# Patient Record
Sex: Female | Born: 1955 | Race: White | Hispanic: No | Smoking: Former smoker
Health system: Southern US, Community
[De-identification: ages and names within clinical notes are randomized; demographics above are authoritative.]

## PROBLEM LIST (undated history)

## (undated) DIAGNOSIS — I10 Essential (primary) hypertension: Secondary | ICD-10-CM

## (undated) DIAGNOSIS — F419 Anxiety disorder, unspecified: Secondary | ICD-10-CM

## (undated) HISTORY — DX: Anxiety disorder, unspecified: F41.9

## (undated) HISTORY — PX: TUBAL LIGATION: SHX77

## (undated) HISTORY — DX: Essential (primary) hypertension: I10

## (undated) HISTORY — PX: NOVASURE ABLATION: SHX5394

---

## 1998-02-27 ENCOUNTER — Ambulatory Visit (HOSPITAL_COMMUNITY): Admission: RE | Admit: 1998-02-27 | Discharge: 1998-02-27 | Payer: Self-pay | Admitting: Neurosurgery

## 1998-03-13 ENCOUNTER — Ambulatory Visit (HOSPITAL_COMMUNITY): Admission: RE | Admit: 1998-03-13 | Discharge: 1998-03-13 | Payer: Self-pay | Admitting: Neurosurgery

## 1998-03-27 ENCOUNTER — Ambulatory Visit (HOSPITAL_COMMUNITY): Admission: RE | Admit: 1998-03-27 | Discharge: 1998-03-27 | Payer: Self-pay | Admitting: Neurosurgery

## 2000-10-13 ENCOUNTER — Other Ambulatory Visit: Admission: RE | Admit: 2000-10-13 | Discharge: 2000-10-13 | Payer: Self-pay | Admitting: *Deleted

## 2000-10-13 ENCOUNTER — Ambulatory Visit (HOSPITAL_COMMUNITY): Admission: RE | Admit: 2000-10-13 | Discharge: 2000-10-13 | Payer: Self-pay | Admitting: *Deleted

## 2000-10-13 ENCOUNTER — Encounter: Payer: Self-pay | Admitting: *Deleted

## 2001-12-30 ENCOUNTER — Ambulatory Visit (HOSPITAL_COMMUNITY): Admission: RE | Admit: 2001-12-30 | Discharge: 2001-12-30 | Payer: Self-pay | Admitting: Family Medicine

## 2001-12-30 ENCOUNTER — Encounter: Payer: Self-pay | Admitting: Family Medicine

## 2005-03-21 ENCOUNTER — Ambulatory Visit (HOSPITAL_COMMUNITY): Admission: RE | Admit: 2005-03-21 | Discharge: 2005-03-21 | Payer: Self-pay | Admitting: General Surgery

## 2005-04-02 ENCOUNTER — Encounter (INDEPENDENT_AMBULATORY_CARE_PROVIDER_SITE_OTHER): Payer: Self-pay | Admitting: General Surgery

## 2005-04-02 ENCOUNTER — Ambulatory Visit (HOSPITAL_COMMUNITY): Admission: RE | Admit: 2005-04-02 | Discharge: 2005-04-02 | Payer: Self-pay | Admitting: General Surgery

## 2006-03-10 ENCOUNTER — Ambulatory Visit: Payer: Self-pay | Admitting: Gastroenterology

## 2008-02-28 ENCOUNTER — Ambulatory Visit (HOSPITAL_COMMUNITY): Admission: RE | Admit: 2008-02-28 | Discharge: 2008-02-28 | Payer: Self-pay | Admitting: Internal Medicine

## 2009-12-25 ENCOUNTER — Ambulatory Visit (HOSPITAL_COMMUNITY): Admission: RE | Admit: 2009-12-25 | Discharge: 2009-12-25 | Payer: Self-pay | Admitting: Internal Medicine

## 2010-07-21 ENCOUNTER — Encounter: Payer: Self-pay | Admitting: Internal Medicine

## 2010-11-15 NOTE — Op Note (Signed)
Alison Allen, SCHREMP                ACCOUNT NO.:  [id number]   MEDICAL RECORD NO.:  [id number]          PATIENT TYPE:  AMB   LOCATION:  DAY                           FACILITY:  [facility]   PHYSICIAN:  Barbaraann Barthel, M.D. DATE OF BIRTH:  Apr 13, 1956   DATE OF PROCEDURE:  04/02/2005  DATE OF DISCHARGE:                                 OPERATIVE REPORT   PREOPERATIVE AND POSTOPERATIVE DIAGNOSIS:  Mass right axilla, final  pathology pending.   SPECIMEN:  Large lipomatous mass from right axilla.   PROCEDURE:  Excision of right axillary mass.   NOTE:  This is a 55 year old white female who presented to the office with  some right axillary asymmetry. She had an MRI which revealed a lipomatous  mass in this area, did not have the appearance of any malignancy, this was  quite large. We planned to remove this as an outpatient. We discussed  complications in detail not limited to but including bleeding, infection and  possibility that more surgery may be required. Informed consent was  obtained.   TECHNIQUE:  The patient was placed in the supine position with head elevated  somewhat the right axilla was prepped with Betadine solution and draped in  usual manner as was the right hemithorax.  A limb isolator was used on the  right upper extremity. A longitudinal incision was carried out just  posterior to the anterior axillary fold.  Skin, subcutaneous tissue was  excised and we entered the axilla and removed a large contained encapsulated  lipomatous mass. This was done carefully.  Small twigs of venial twigs were  clipped with a hemoclip device and a larger ones with 3-0 Vicryl ties. These  were all small tributaries. We carefully preserved from dissection the  axillary large vessels. We removed the lipoma in toto and removed this, then  sent this for final pathology. The axilla was then irrigated with normal  saline solution. We checked for hemostasis. A subcutaneous area was closed  with 2-0  Polysorb. The skin was closed with a subcuticular 4-0 Polysorb  suture with Steri-Strips, Neosporin and a sterile dressing applied. Prior to  closure all sponge views were counts found to be correct. Estimated blood  loss was minimal. No drains were placed. There were no complications.      Barbaraann Barthel, M.D.  Electronically Signed     WB/MEDQ  D:  04/02/2005  T:  04/02/2005  Job:  [id number]   cc:   Madelin Rear. Sherwood Gambler, MD  Fax: [phone]

## 2015-01-10 ENCOUNTER — Other Ambulatory Visit (HOSPITAL_COMMUNITY): Payer: Self-pay | Admitting: Internal Medicine

## 2015-01-10 DIAGNOSIS — Z1231 Encounter for screening mammogram for malignant neoplasm of breast: Secondary | ICD-10-CM

## 2015-01-22 ENCOUNTER — Ambulatory Visit (HOSPITAL_COMMUNITY): Payer: Self-pay

## 2015-01-25 ENCOUNTER — Ambulatory Visit (HOSPITAL_COMMUNITY)
Admission: RE | Admit: 2015-01-25 | Discharge: 2015-01-25 | Disposition: A | Discharge status: Home / Self Care | Payer: BLUE CROSS/BLUE SHIELD | Source: Ambulatory Visit | Attending: Internal Medicine | Admitting: Internal Medicine

## 2015-01-25 DIAGNOSIS — Z1231 Encounter for screening mammogram for malignant neoplasm of breast: Secondary | ICD-10-CM

## 2015-11-12 DIAGNOSIS — Z1389 Encounter for screening for other disorder: Secondary | ICD-10-CM | POA: Diagnosis not present

## 2015-11-12 DIAGNOSIS — Z6821 Body mass index (BMI) 21.0-21.9, adult: Secondary | ICD-10-CM | POA: Diagnosis not present

## 2015-11-12 DIAGNOSIS — F419 Anxiety disorder, unspecified: Secondary | ICD-10-CM | POA: Diagnosis not present

## 2015-11-12 DIAGNOSIS — I10 Essential (primary) hypertension: Secondary | ICD-10-CM | POA: Diagnosis not present

## 2015-11-12 DIAGNOSIS — G894 Chronic pain syndrome: Secondary | ICD-10-CM | POA: Diagnosis not present

## 2016-02-05 DIAGNOSIS — L0292 Furuncle, unspecified: Secondary | ICD-10-CM | POA: Diagnosis not present

## 2016-02-05 DIAGNOSIS — F419 Anxiety disorder, unspecified: Secondary | ICD-10-CM | POA: Diagnosis not present

## 2016-02-05 DIAGNOSIS — I10 Essential (primary) hypertension: Secondary | ICD-10-CM | POA: Diagnosis not present

## 2016-02-05 DIAGNOSIS — E063 Autoimmune thyroiditis: Secondary | ICD-10-CM | POA: Diagnosis not present

## 2016-02-05 DIAGNOSIS — Z1389 Encounter for screening for other disorder: Secondary | ICD-10-CM | POA: Diagnosis not present

## 2016-02-05 DIAGNOSIS — Z6820 Body mass index (BMI) 20.0-20.9, adult: Secondary | ICD-10-CM | POA: Diagnosis not present

## 2016-02-05 DIAGNOSIS — G894 Chronic pain syndrome: Secondary | ICD-10-CM | POA: Diagnosis not present

## 2016-07-03 DIAGNOSIS — F419 Anxiety disorder, unspecified: Secondary | ICD-10-CM | POA: Diagnosis not present

## 2016-07-03 DIAGNOSIS — I10 Essential (primary) hypertension: Secondary | ICD-10-CM | POA: Diagnosis not present

## 2016-07-03 DIAGNOSIS — Z6821 Body mass index (BMI) 21.0-21.9, adult: Secondary | ICD-10-CM | POA: Diagnosis not present

## 2016-07-03 DIAGNOSIS — Z1389 Encounter for screening for other disorder: Secondary | ICD-10-CM | POA: Diagnosis not present

## 2016-11-11 DIAGNOSIS — Z6821 Body mass index (BMI) 21.0-21.9, adult: Secondary | ICD-10-CM | POA: Diagnosis not present

## 2016-11-11 DIAGNOSIS — F419 Anxiety disorder, unspecified: Secondary | ICD-10-CM | POA: Diagnosis not present

## 2016-11-11 DIAGNOSIS — Z1389 Encounter for screening for other disorder: Secondary | ICD-10-CM | POA: Diagnosis not present

## 2016-11-11 DIAGNOSIS — I10 Essential (primary) hypertension: Secondary | ICD-10-CM | POA: Diagnosis not present

## 2016-11-11 DIAGNOSIS — G5702 Lesion of sciatic nerve, left lower limb: Secondary | ICD-10-CM | POA: Diagnosis not present

## 2016-11-11 DIAGNOSIS — E063 Autoimmune thyroiditis: Secondary | ICD-10-CM | POA: Diagnosis not present

## 2016-12-17 DIAGNOSIS — Z1211 Encounter for screening for malignant neoplasm of colon: Secondary | ICD-10-CM | POA: Diagnosis not present

## 2017-04-13 DIAGNOSIS — J069 Acute upper respiratory infection, unspecified: Secondary | ICD-10-CM | POA: Diagnosis not present

## 2017-04-13 DIAGNOSIS — J209 Acute bronchitis, unspecified: Secondary | ICD-10-CM | POA: Diagnosis not present

## 2017-04-13 DIAGNOSIS — J029 Acute pharyngitis, unspecified: Secondary | ICD-10-CM | POA: Diagnosis not present

## 2017-04-17 DIAGNOSIS — Z1389 Encounter for screening for other disorder: Secondary | ICD-10-CM | POA: Diagnosis not present

## 2017-04-17 DIAGNOSIS — J069 Acute upper respiratory infection, unspecified: Secondary | ICD-10-CM | POA: Diagnosis not present

## 2017-04-17 DIAGNOSIS — Z6821 Body mass index (BMI) 21.0-21.9, adult: Secondary | ICD-10-CM | POA: Diagnosis not present

## 2017-04-22 DIAGNOSIS — Z6821 Body mass index (BMI) 21.0-21.9, adult: Secondary | ICD-10-CM | POA: Diagnosis not present

## 2017-04-22 DIAGNOSIS — J9801 Acute bronchospasm: Secondary | ICD-10-CM | POA: Diagnosis not present

## 2017-04-22 DIAGNOSIS — R05 Cough: Secondary | ICD-10-CM | POA: Diagnosis not present

## 2017-04-22 DIAGNOSIS — T3695XA Adverse effect of unspecified systemic antibiotic, initial encounter: Secondary | ICD-10-CM | POA: Diagnosis not present

## 2017-04-22 DIAGNOSIS — Z1389 Encounter for screening for other disorder: Secondary | ICD-10-CM | POA: Diagnosis not present

## 2017-05-05 ENCOUNTER — Encounter: Payer: Self-pay | Admitting: General Practice

## 2017-07-07 DIAGNOSIS — R509 Fever, unspecified: Secondary | ICD-10-CM | POA: Diagnosis not present

## 2017-07-07 DIAGNOSIS — Z1389 Encounter for screening for other disorder: Secondary | ICD-10-CM | POA: Diagnosis not present

## 2017-07-07 DIAGNOSIS — J3489 Other specified disorders of nose and nasal sinuses: Secondary | ICD-10-CM | POA: Diagnosis not present

## 2017-07-07 DIAGNOSIS — R05 Cough: Secondary | ICD-10-CM | POA: Diagnosis not present

## 2017-07-07 DIAGNOSIS — J029 Acute pharyngitis, unspecified: Secondary | ICD-10-CM | POA: Diagnosis not present

## 2017-07-07 DIAGNOSIS — Z6821 Body mass index (BMI) 21.0-21.9, adult: Secondary | ICD-10-CM | POA: Diagnosis not present

## 2017-07-21 ENCOUNTER — Other Ambulatory Visit (HOSPITAL_COMMUNITY): Payer: Self-pay | Admitting: Internal Medicine

## 2017-07-21 DIAGNOSIS — R490 Dysphonia: Secondary | ICD-10-CM | POA: Diagnosis not present

## 2017-07-21 DIAGNOSIS — R05 Cough: Secondary | ICD-10-CM | POA: Diagnosis not present

## 2017-07-21 DIAGNOSIS — F419 Anxiety disorder, unspecified: Secondary | ICD-10-CM | POA: Diagnosis not present

## 2017-07-21 DIAGNOSIS — Z1231 Encounter for screening mammogram for malignant neoplasm of breast: Secondary | ICD-10-CM

## 2017-07-21 DIAGNOSIS — K219 Gastro-esophageal reflux disease without esophagitis: Secondary | ICD-10-CM | POA: Diagnosis not present

## 2017-07-21 DIAGNOSIS — Z1389 Encounter for screening for other disorder: Secondary | ICD-10-CM | POA: Diagnosis not present

## 2017-07-21 DIAGNOSIS — Z6820 Body mass index (BMI) 20.0-20.9, adult: Secondary | ICD-10-CM | POA: Diagnosis not present

## 2017-08-03 DIAGNOSIS — E748 Other specified disorders of carbohydrate metabolism: Secondary | ICD-10-CM | POA: Diagnosis not present

## 2017-08-13 ENCOUNTER — Ambulatory Visit (HOSPITAL_COMMUNITY)
Admission: RE | Admit: 2017-08-13 | Discharge: 2017-08-13 | Disposition: A | Discharge status: Home / Self Care | Payer: BLUE CROSS/BLUE SHIELD | Source: Ambulatory Visit | Attending: Internal Medicine | Admitting: Internal Medicine

## 2017-08-13 ENCOUNTER — Encounter (HOSPITAL_COMMUNITY): Payer: Self-pay

## 2017-08-13 DIAGNOSIS — Z1231 Encounter for screening mammogram for malignant neoplasm of breast: Secondary | ICD-10-CM

## 2017-10-21 DIAGNOSIS — Z1389 Encounter for screening for other disorder: Secondary | ICD-10-CM | POA: Diagnosis not present

## 2017-10-21 DIAGNOSIS — I10 Essential (primary) hypertension: Secondary | ICD-10-CM | POA: Diagnosis not present

## 2017-10-21 DIAGNOSIS — K130 Diseases of lips: Secondary | ICD-10-CM | POA: Diagnosis not present

## 2017-10-21 DIAGNOSIS — M5432 Sciatica, left side: Secondary | ICD-10-CM | POA: Diagnosis not present

## 2017-10-21 DIAGNOSIS — Z6820 Body mass index (BMI) 20.0-20.9, adult: Secondary | ICD-10-CM | POA: Diagnosis not present

## 2017-10-26 ENCOUNTER — Encounter: Payer: Self-pay | Admitting: Gastroenterology

## 2017-11-04 DIAGNOSIS — G5702 Lesion of sciatic nerve, left lower limb: Secondary | ICD-10-CM | POA: Diagnosis not present

## 2017-11-04 DIAGNOSIS — Z6821 Body mass index (BMI) 21.0-21.9, adult: Secondary | ICD-10-CM | POA: Diagnosis not present

## 2017-11-04 DIAGNOSIS — H11009 Unspecified pterygium of unspecified eye: Secondary | ICD-10-CM | POA: Diagnosis not present

## 2017-11-04 DIAGNOSIS — Z1389 Encounter for screening for other disorder: Secondary | ICD-10-CM | POA: Diagnosis not present

## 2017-11-11 ENCOUNTER — Encounter: Payer: Self-pay | Admitting: Gastroenterology

## 2017-11-19 DIAGNOSIS — M545 Low back pain: Secondary | ICD-10-CM | POA: Diagnosis not present

## 2017-11-19 DIAGNOSIS — Z6821 Body mass index (BMI) 21.0-21.9, adult: Secondary | ICD-10-CM | POA: Diagnosis not present

## 2017-11-19 DIAGNOSIS — M5410 Radiculopathy, site unspecified: Secondary | ICD-10-CM | POA: Diagnosis not present

## 2017-11-19 DIAGNOSIS — G5702 Lesion of sciatic nerve, left lower limb: Secondary | ICD-10-CM | POA: Diagnosis not present

## 2017-11-19 DIAGNOSIS — Z1389 Encounter for screening for other disorder: Secondary | ICD-10-CM | POA: Diagnosis not present

## 2017-12-03 ENCOUNTER — Ambulatory Visit: Payer: BLUE CROSS/BLUE SHIELD

## 2017-12-03 DIAGNOSIS — M5416 Radiculopathy, lumbar region: Secondary | ICD-10-CM | POA: Diagnosis not present

## 2017-12-03 DIAGNOSIS — M6281 Muscle weakness (generalized): Secondary | ICD-10-CM | POA: Diagnosis not present

## 2017-12-03 DIAGNOSIS — M79662 Pain in left lower leg: Secondary | ICD-10-CM | POA: Diagnosis not present

## 2017-12-03 DIAGNOSIS — M79652 Pain in left thigh: Secondary | ICD-10-CM | POA: Diagnosis not present

## 2017-12-07 DIAGNOSIS — G5702 Lesion of sciatic nerve, left lower limb: Secondary | ICD-10-CM | POA: Diagnosis not present

## 2017-12-07 DIAGNOSIS — J329 Chronic sinusitis, unspecified: Secondary | ICD-10-CM | POA: Diagnosis not present

## 2017-12-07 DIAGNOSIS — I10 Essential (primary) hypertension: Secondary | ICD-10-CM | POA: Diagnosis not present

## 2017-12-07 DIAGNOSIS — E063 Autoimmune thyroiditis: Secondary | ICD-10-CM | POA: Diagnosis not present

## 2017-12-07 DIAGNOSIS — M5126 Other intervertebral disc displacement, lumbar region: Secondary | ICD-10-CM | POA: Diagnosis not present

## 2017-12-07 DIAGNOSIS — M5417 Radiculopathy, lumbosacral region: Secondary | ICD-10-CM | POA: Diagnosis not present

## 2017-12-07 DIAGNOSIS — Z1389 Encounter for screening for other disorder: Secondary | ICD-10-CM | POA: Diagnosis not present

## 2017-12-08 ENCOUNTER — Ambulatory Visit: Payer: BLUE CROSS/BLUE SHIELD

## 2018-03-24 DIAGNOSIS — Z6820 Body mass index (BMI) 20.0-20.9, adult: Secondary | ICD-10-CM | POA: Diagnosis not present

## 2018-03-24 DIAGNOSIS — I10 Essential (primary) hypertension: Secondary | ICD-10-CM | POA: Diagnosis not present

## 2018-03-24 DIAGNOSIS — E063 Autoimmune thyroiditis: Secondary | ICD-10-CM | POA: Diagnosis not present

## 2018-03-24 DIAGNOSIS — J329 Chronic sinusitis, unspecified: Secondary | ICD-10-CM | POA: Diagnosis not present

## 2018-03-24 DIAGNOSIS — Z1389 Encounter for screening for other disorder: Secondary | ICD-10-CM | POA: Diagnosis not present

## 2018-03-24 DIAGNOSIS — G894 Chronic pain syndrome: Secondary | ICD-10-CM | POA: Diagnosis not present

## 2018-04-07 ENCOUNTER — Encounter: Payer: Self-pay | Admitting: Gastroenterology

## 2018-06-10 DIAGNOSIS — D2371 Other benign neoplasm of skin of right lower limb, including hip: Secondary | ICD-10-CM | POA: Diagnosis not present

## 2018-06-10 DIAGNOSIS — B078 Other viral warts: Secondary | ICD-10-CM | POA: Diagnosis not present

## 2018-06-10 DIAGNOSIS — I781 Nevus, non-neoplastic: Secondary | ICD-10-CM | POA: Diagnosis not present

## 2018-07-06 DIAGNOSIS — F419 Anxiety disorder, unspecified: Secondary | ICD-10-CM | POA: Diagnosis not present

## 2018-07-06 DIAGNOSIS — Z1389 Encounter for screening for other disorder: Secondary | ICD-10-CM | POA: Diagnosis not present

## 2018-07-06 DIAGNOSIS — I10 Essential (primary) hypertension: Secondary | ICD-10-CM | POA: Diagnosis not present

## 2018-07-06 DIAGNOSIS — Z6820 Body mass index (BMI) 20.0-20.9, adult: Secondary | ICD-10-CM | POA: Diagnosis not present

## 2018-07-19 ENCOUNTER — Other Ambulatory Visit: Payer: Self-pay

## 2018-07-19 ENCOUNTER — Encounter: Payer: Self-pay | Admitting: Gastroenterology

## 2018-07-19 ENCOUNTER — Ambulatory Visit: Payer: BLUE CROSS/BLUE SHIELD | Admitting: Gastroenterology

## 2018-07-19 VITALS — BP 140/81 | HR 102 | Temp 98.0°F | Ht 66.0 in | Wt 138.0 lb

## 2018-07-19 DIAGNOSIS — Z83719 Family history of colon polyps, unspecified: Secondary | ICD-10-CM | POA: Insufficient documentation

## 2018-07-19 DIAGNOSIS — Z1211 Encounter for screening for malignant neoplasm of colon: Secondary | ICD-10-CM

## 2018-07-19 DIAGNOSIS — Z8371 Family history of colonic polyps: Secondary | ICD-10-CM

## 2018-07-19 DIAGNOSIS — Z1159 Encounter for screening for other viral diseases: Secondary | ICD-10-CM | POA: Diagnosis not present

## 2018-07-19 MED ORDER — NA SULFATE-K SULFATE-MG SULF 17.5-3.13-1.6 GM/177ML PO SOLN: 1.0000 | ORAL | 0 refills | Status: DC

## 2018-07-19 NOTE — Patient Instructions (Signed)
Please have blood work done.  We have arranged a colonoscopy with Dr. Darrick Penna in the near future.  Further recommendations to follow!  It was a pleasure to see you today. I strive to create trusting relationships with patients to provide genuine, compassionate, and quality care. I value your feedback. If you receive a survey regarding your visit,  I greatly appreciate you taking time to fill this out.   Gelene Mink, PhD, ANP-BC [facility]

## 2018-07-19 NOTE — Progress Notes (Signed)
Primary Care Physician:  Elfredia Nevins, MD Primary Gastroenterologist:  Dr. Darrick Penna   Chief Complaint  Patient presents with  . Consult    TCS. Never had 1 prior. Sister has hx polyps. Wants to discuss checking liver    HPI:   Alison Allen is a 63 y.o. female presenting today at the request of Dr. Sherwood Gambler due to need for initial screening colonoscopy. Her sister was a previous patient at [facility] and had multiple (32) adenomas at time of colonoscopy in her mid 77s.   She is concerned about testing for Hepatitis C, as she has never had this before. No abdominal pain, N/V, rectal bleeding, dysphagia, GERD, weight loss, lack of appetite.   Past Medical History:  Diagnosis Date  . Anxiety   . Hypertension     Past Surgical History:  Procedure Laterality Date  . NOVASURE ABLATION    . TUBAL LIGATION      Current Outpatient Medications  Medication Sig Dispense Refill  . alprazolam (XANAX) 2 MG tablet Take 2 mg by mouth. 1-2 times per day and a quarter at night    . enalapril (VASOTEC) 20 MG tablet Take 20 mg by mouth daily.    . hydrochlorothiazide (HYDRODIURIL) 25 MG tablet Take 25 mg by mouth daily.     No current facility-administered medications for this visit.     Allergies as of 07/19/2018  . (No Known Allergies)    Family History  Problem Relation Age of Onset  . Dementia Mother        deceased at age 22  . Stroke Father 27       deceased due to stroke  . Colon polyps Sister     Social History   Socioeconomic History  . Marital status: Married    Spouse name: Not on file  . Number of children: Not on file  . Years of education: Not on file  . Highest education level: Not on file  Occupational History  . Not on file  Social Needs  . Financial resource strain: Not on file  . Food insecurity:    Worry: Not on file    Inability: Not on file  . Transportation needs:    Medical: Not on file    Non-medical: Not on file  Tobacco Use  . Smoking status:  Former Smoker    Types: Cigarettes  . Smokeless tobacco: Never Used  Substance and Sexual Activity  . Alcohol use: Yes    Comment: occas  . Drug use: Never  . Sexual activity: Not on file  Lifestyle  . Physical activity:    Days per week: Not on file    Minutes per session: Not on file  . Stress: Not on file  Relationships  . Social connections:    Talks on phone: Not on file    Gets together: Not on file    Attends religious service: Not on file    Active member of club or organization: Not on file    Attends meetings of clubs or organizations: Not on file    Relationship status: Not on file  . Intimate partner violence:    Fear of current or ex partner: Not on file    Emotionally abused: Not on file    Physically abused: Not on file    Forced sexual activity: Not on file  Other Topics Concern  . Not on file  Social History Narrative  . Not on file    Review  of Systems: Gen: Denies any fever, chills, fatigue, weight loss, lack of appetite.  CV: Denies chest pain, heart palpitations, peripheral edema, syncope.  Resp: Denies shortness of breath at rest or with exertion. Denies wheezing or cough.  GI: see HPI GU : Denies urinary burning, urinary frequency, urinary hesitancy MS: Denies joint pain, muscle weakness, cramps, or limitation of movement.  Derm: Denies rash, itching, dry skin Psych: Denies depression, anxiety, memory loss, and confusion Heme: Denies bruising, bleeding, and enlarged lymph nodes.  Physical Exam: BP 140/81   Pulse (!) 102   Temp 98 F (36.7 C) (Oral)   Ht 5\' 6"  (1.676 m)   Wt 138 lb (62.6 kg)   BMI 22.27 kg/m  General:   Alert and oriented. Pleasant and cooperative. Well-nourished and well-developed.  Head:  Normocephalic and atraumatic. Eyes:  Without icterus, sclera clear and conjunctiva pink.  Ears:  Normal auditory acuity. Nose:  No deformity, discharge,  or lesions. Mouth:  No deformity or lesions, oral mucosa pink.  Lungs:  Clear to  auscultation bilaterally. No wheezes, rales, or rhonchi. No distress.  Heart:  S1, S2 present without murmurs appreciated.  Abdomen:  +BS, soft, non-tender and non-distended. No HSM noted. No guarding or rebound. No masses appreciated.  Rectal:  Deferred  Msk:  Symmetrical without gross deformities. Normal posture. Extremities:  Without  edema. Neurologic:  Alert and  oriented x4 Psych:  Alert and cooperative. Normal mood and affect.

## 2018-07-20 DIAGNOSIS — Z1159 Encounter for screening for other viral diseases: Secondary | ICD-10-CM | POA: Diagnosis not present

## 2018-07-21 LAB — HEPATITIS C ANTIBODY
HEP C AB: NONREACTIVE
SIGNAL TO CUT-OFF: 0.02 (ref <1.00)

## 2018-07-22 NOTE — Progress Notes (Signed)
PT is aware.

## 2018-07-22 NOTE — Progress Notes (Signed)
LMOM for a return call.  

## 2018-07-25 ENCOUNTER — Encounter: Payer: Self-pay | Admitting: Gastroenterology

## 2018-07-25 NOTE — Assessment & Plan Note (Signed)
Routine one-time screening due. Hep C antibody ordered and was negative.

## 2018-07-25 NOTE — Assessment & Plan Note (Signed)
Sister with numerous polyps.

## 2018-07-25 NOTE — Assessment & Plan Note (Signed)
63 year old female without any concerning lower or upper GI signs/symptoms, presenting with need for initial screening colonoscopy. Sister had numerous polyps in her 75s (60 adenomas) and was previously a patient here at [facility].   Proceed with colonoscopy with Dr. Darrick Penna in the near future. The risks, benefits, and alternatives have been discussed in detail with the patient. They state understanding and desire to proceed.  Discussed need for frequent colonoscopy (minimum every 5 years) due to family history.

## 2018-07-27 NOTE — Progress Notes (Signed)
CC'D TO PCP °

## 2018-07-29 ENCOUNTER — Telehealth: Payer: Self-pay

## 2018-07-29 ENCOUNTER — Other Ambulatory Visit: Payer: Self-pay

## 2018-07-29 MED ORDER — PEG 3350-KCL-NA BICARB-NACL 420 G PO SOLR: 4000.0000 mL | ORAL | 0 refills | Status: DC

## 2018-07-29 NOTE — Telephone Encounter (Signed)
Pt called office, Suprep was going to cost $50. She requested cheaper prep. Tri-Lyte rx sent to pharmacy. New instructions mailed.

## 2018-08-25 DIAGNOSIS — Z1389 Encounter for screening for other disorder: Secondary | ICD-10-CM | POA: Diagnosis not present

## 2018-08-25 DIAGNOSIS — E063 Autoimmune thyroiditis: Secondary | ICD-10-CM | POA: Diagnosis not present

## 2018-08-25 DIAGNOSIS — Z23 Encounter for immunization: Secondary | ICD-10-CM | POA: Diagnosis not present

## 2018-08-25 DIAGNOSIS — Z6820 Body mass index (BMI) 20.0-20.9, adult: Secondary | ICD-10-CM | POA: Diagnosis not present

## 2018-08-25 DIAGNOSIS — F419 Anxiety disorder, unspecified: Secondary | ICD-10-CM | POA: Diagnosis not present

## 2018-08-25 DIAGNOSIS — L719 Rosacea, unspecified: Secondary | ICD-10-CM | POA: Diagnosis not present

## 2018-09-14 ENCOUNTER — Telehealth: Payer: Self-pay | Admitting: Gastroenterology

## 2018-09-14 NOTE — Telephone Encounter (Signed)
Patient is rescheduled to 6/26 at 1pm. Patient aware will need to arrive at 1pm. I have mailed new instructions. Called endo and LMOVM for carolyn making aware of change

## 2018-09-14 NOTE — Telephone Encounter (Signed)
Pt wants to reschedule her procedure on Friday with SF due to corona virus.Please call [phone]

## 2018-12-01 DIAGNOSIS — F419 Anxiety disorder, unspecified: Secondary | ICD-10-CM | POA: Diagnosis not present

## 2018-12-01 DIAGNOSIS — I10 Essential (primary) hypertension: Secondary | ICD-10-CM | POA: Diagnosis not present

## 2018-12-01 DIAGNOSIS — Z6820 Body mass index (BMI) 20.0-20.9, adult: Secondary | ICD-10-CM | POA: Diagnosis not present

## 2018-12-01 DIAGNOSIS — Z1389 Encounter for screening for other disorder: Secondary | ICD-10-CM | POA: Diagnosis not present

## 2018-12-14 ENCOUNTER — Telehealth: Payer: Self-pay | Admitting: *Deleted

## 2018-12-14 NOTE — Telephone Encounter (Signed)
Lmom for pt to call us back to schedule COVID 19 screening.   

## 2018-12-15 ENCOUNTER — Telehealth: Payer: Self-pay | Admitting: *Deleted

## 2018-12-15 NOTE — Telephone Encounter (Signed)
Pt called back and is scheduled for her COVID screening on 12/21/2018.  Pt is aware to remain in quarantine once testing is done.  Pt voiced understanding.

## 2018-12-15 NOTE — Telephone Encounter (Signed)
LMOM of pt and her emergency contact for her to call us back.  Her emergency contact is listed on her DPR.

## 2018-12-20 ENCOUNTER — Telehealth: Payer: Self-pay | Admitting: Gastroenterology

## 2018-12-20 NOTE — Telephone Encounter (Signed)
Pt needs to cancel her colonoscopy with SF on 6/26. Her job is closing down and she will be losing her insurance.

## 2018-12-20 NOTE — Telephone Encounter (Signed)
Spoke with patient. She is going to be furloughed from her job for 3 months and will lose her insurance. She wants to cancel for now and will reschedule once she starts back working again. FYI to AB. LMOVM for endo letting know.

## 2018-12-21 ENCOUNTER — Other Ambulatory Visit (HOSPITAL_COMMUNITY): Payer: BLUE CROSS/BLUE SHIELD

## 2018-12-24 ENCOUNTER — Ambulatory Visit (HOSPITAL_COMMUNITY)
Admission: RE | Admit: 2018-12-24 | Payer: BLUE CROSS/BLUE SHIELD | Source: Home / Self Care | Admitting: Gastroenterology

## 2018-12-24 ENCOUNTER — Encounter (HOSPITAL_COMMUNITY): Admission: RE | Payer: Self-pay | Source: Home / Self Care

## 2018-12-24 SURGERY — COLONOSCOPY
Anesthesia: Moderate Sedation

## 2019-01-19 DIAGNOSIS — M950 Acquired deformity of nose: Secondary | ICD-10-CM | POA: Diagnosis not present

## 2019-01-19 DIAGNOSIS — J342 Deviated nasal septum: Secondary | ICD-10-CM | POA: Diagnosis not present

## 2019-03-03 DIAGNOSIS — F419 Anxiety disorder, unspecified: Secondary | ICD-10-CM | POA: Diagnosis not present

## 2019-03-03 DIAGNOSIS — Z681 Body mass index (BMI) 19 or less, adult: Secondary | ICD-10-CM | POA: Diagnosis not present

## 2019-03-03 DIAGNOSIS — I10 Essential (primary) hypertension: Secondary | ICD-10-CM | POA: Diagnosis not present

## 2019-06-07 DIAGNOSIS — F419 Anxiety disorder, unspecified: Secondary | ICD-10-CM | POA: Diagnosis not present

## 2019-06-07 DIAGNOSIS — Z681 Body mass index (BMI) 19 or less, adult: Secondary | ICD-10-CM | POA: Diagnosis not present

## 2019-09-26 DIAGNOSIS — Z681 Body mass index (BMI) 19 or less, adult: Secondary | ICD-10-CM | POA: Diagnosis not present

## 2019-09-26 DIAGNOSIS — M2010 Hallux valgus (acquired), unspecified foot: Secondary | ICD-10-CM | POA: Diagnosis not present

## 2019-09-26 DIAGNOSIS — F419 Anxiety disorder, unspecified: Secondary | ICD-10-CM | POA: Diagnosis not present

## 2019-12-26 DIAGNOSIS — F419 Anxiety disorder, unspecified: Secondary | ICD-10-CM | POA: Diagnosis not present

## 2019-12-26 DIAGNOSIS — I10 Essential (primary) hypertension: Secondary | ICD-10-CM | POA: Diagnosis not present

## 2019-12-26 DIAGNOSIS — Z681 Body mass index (BMI) 19 or less, adult: Secondary | ICD-10-CM | POA: Diagnosis not present

## 2019-12-26 DIAGNOSIS — L709 Acne, unspecified: Secondary | ICD-10-CM | POA: Diagnosis not present

## 2019-12-26 DIAGNOSIS — J300 Vasomotor rhinitis: Secondary | ICD-10-CM | POA: Diagnosis not present

## 2020-02-13 DIAGNOSIS — M2012 Hallux valgus (acquired), left foot: Secondary | ICD-10-CM | POA: Diagnosis not present

## 2020-02-13 DIAGNOSIS — M79675 Pain in left toe(s): Secondary | ICD-10-CM | POA: Diagnosis not present

## 2020-03-14 DIAGNOSIS — I10 Essential (primary) hypertension: Secondary | ICD-10-CM | POA: Diagnosis not present

## 2020-03-14 DIAGNOSIS — F419 Anxiety disorder, unspecified: Secondary | ICD-10-CM | POA: Diagnosis not present

## 2020-03-14 DIAGNOSIS — Z681 Body mass index (BMI) 19 or less, adult: Secondary | ICD-10-CM | POA: Diagnosis not present

## 2020-03-14 DIAGNOSIS — L570 Actinic keratosis: Secondary | ICD-10-CM | POA: Diagnosis not present

## 2020-03-26 DIAGNOSIS — R0982 Postnasal drip: Secondary | ICD-10-CM | POA: Diagnosis not present

## 2020-03-26 DIAGNOSIS — J310 Chronic rhinitis: Secondary | ICD-10-CM | POA: Diagnosis not present

## 2020-03-26 DIAGNOSIS — J343 Hypertrophy of nasal turbinates: Secondary | ICD-10-CM | POA: Diagnosis not present

## 2020-03-26 DIAGNOSIS — J342 Deviated nasal septum: Secondary | ICD-10-CM | POA: Diagnosis not present

## 2020-04-27 DIAGNOSIS — J343 Hypertrophy of nasal turbinates: Secondary | ICD-10-CM | POA: Diagnosis not present

## 2020-04-27 DIAGNOSIS — J310 Chronic rhinitis: Secondary | ICD-10-CM | POA: Diagnosis not present

## 2020-04-27 DIAGNOSIS — J342 Deviated nasal septum: Secondary | ICD-10-CM | POA: Diagnosis not present

## 2020-04-27 DIAGNOSIS — R0982 Postnasal drip: Secondary | ICD-10-CM | POA: Diagnosis not present

## 2020-05-03 DIAGNOSIS — K5792 Diverticulitis of intestine, part unspecified, without perforation or abscess without bleeding: Secondary | ICD-10-CM | POA: Diagnosis not present

## 2020-05-09 ENCOUNTER — Ambulatory Visit: Payer: BC Managed Care – PPO | Admitting: Internal Medicine

## 2020-05-09 ENCOUNTER — Encounter: Payer: Self-pay | Admitting: Internal Medicine

## 2020-05-09 ENCOUNTER — Other Ambulatory Visit: Payer: Self-pay

## 2020-05-09 VITALS — BP 132/91 | HR 116 | Temp 97.5°F | Ht 66.0 in | Wt 123.2 lb

## 2020-05-09 DIAGNOSIS — Z83719 Family history of colon polyps, unspecified: Secondary | ICD-10-CM

## 2020-05-09 DIAGNOSIS — Z8371 Family history of colonic polyps: Secondary | ICD-10-CM | POA: Diagnosis not present

## 2020-05-09 DIAGNOSIS — Z1211 Encounter for screening for malignant neoplasm of colon: Secondary | ICD-10-CM

## 2020-05-09 NOTE — Progress Notes (Signed)
Referring Provider: Elfredia Nevins, MD Primary Care Physician:  Elfredia Nevins, MD Primary GI:  Dr. Marletta Lor  Chief Complaint  Patient presents with  . Abdominal Pain    lower abd, comes/goes. saw UC thursday and prescribed cipro/metrodanzole. Not taking how she is supposed to  . Gas  . Diarrhea    daily, but very little    HPI:   Alison Allen is a 64 y.o. female who presents to the clinic today for follow-up visit.  She was previously seen by Lewie Loron to discuss colon cancer screening.  This was in January 2020.  Unfortunately she never underwent colonoscopy as Covid hit and she was worried about contracting the virus.  Her sister who was a previous patient here recently died from complications from hepatitis C cirrhosis and HCC.  Of note she had 30+ polyps on colonoscopy as well.  Patient denies any melena hematochezia.  States she is working again and would like to have her colonoscopy scheduled.  Past Medical History:  Diagnosis Date  . Anxiety   . Hypertension     Past Surgical History:  Procedure Laterality Date  . NOVASURE ABLATION    . TUBAL LIGATION      Current Outpatient Medications  Medication Sig Dispense Refill  . alprazolam (XANAX) 2 MG tablet Take 2 mg by mouth at bedtime as needed for sleep. 1-2 times per day and a quarter at night    . CALCIUM-VITAMIN D PO Take 1 tablet by mouth daily.    . ciprofloxacin (CIPRO) 250 MG tablet 3 tablets twice a day    . clindamycin-benzoyl peroxide (BENZACLIN) gel Apply 1 application topically daily as needed (oily skin).    . enalapril (VASOTEC) 20 MG tablet Take 20 mg by mouth daily.    . hydrochlorothiazide (HYDRODIURIL) 25 MG tablet Take 25 mg by mouth daily.    . metroNIDAZOLE (FLAGYL) 500 MG tablet Take 500 mg by mouth every 6 (six) hours.    . Multiple Vitamins-Minerals (MULTIVITAMIN WITH MINERALS) tablet Take 1 tablet by mouth daily.     No current facility-administered medications for this visit.     Allergies as of 05/09/2020  . (No Known Allergies)    Family History  Problem Relation Age of Onset  . Dementia Mother        deceased at age 42  . Stroke Father 21       deceased due to stroke  . Colon polyps Sister        in mid 60s with 53 adenomas    Social History   Socioeconomic History  . Marital status: Married    Spouse name: Not on file  . Number of children: Not on file  . Years of education: Not on file  . Highest education level: Not on file  Occupational History  . Not on file  Tobacco Use  . Smoking status: Former Smoker    Types: Cigarettes  . Smokeless tobacco: Never Used  Substance and Sexual Activity  . Alcohol use: Yes    Comment: occas  . Drug use: Never  . Sexual activity: Not on file  Other Topics Concern  . Not on file  Social History Narrative  . Not on file   Social Determinants of Health   Financial Resource Strain:   . Difficulty of Paying Living Expenses: Not on file  Food Insecurity:   . Worried About Programme researcher, broadcasting/film/video in the Last Year: Not on file  . Ran  Out of Food in the Last Year: Not on file  Transportation Needs:   . Lack of Transportation (Medical): Not on file  . Lack of Transportation (Non-Medical): Not on file  Physical Activity:   . Days of Exercise per Week: Not on file  . Minutes of Exercise per Session: Not on file  Stress:   . Feeling of Stress : Not on file  Social Connections:   . Frequency of Communication with Friends and Family: Not on file  . Frequency of Social Gatherings with Friends and Family: Not on file  . Attends Religious Services: Not on file  . Active Member of Clubs or Organizations: Not on file  . Attends Banker Meetings: Not on file  . Marital Status: Not on file    Subjective: Review of Systems  Constitutional: Negative for chills and fever.  HENT: Negative for congestion and hearing loss.   Eyes: Negative for blurred vision and double vision.  Respiratory:  Negative for cough and shortness of breath.   Cardiovascular: Negative for chest pain and palpitations.  Gastrointestinal: Negative for abdominal pain, blood in stool, constipation, diarrhea, heartburn, melena and vomiting.  Genitourinary: Negative for dysuria and urgency.  Musculoskeletal: Negative for joint pain and myalgias.  Skin: Negative for itching and rash.  Neurological: Negative for dizziness and headaches.  Psychiatric/Behavioral: Negative for depression. The patient is not nervous/anxious.      Objective: BP (!) 132/91   Pulse (!) 116   Temp (!) 97.5 F (36.4 C)   Ht 5\' 6"  (1.676 m)   Wt 123 lb 3.2 oz (55.9 kg)   BMI 19.89 kg/m  Physical Exam Constitutional:      Appearance: Normal appearance.  HENT:     Head: Normocephalic and atraumatic.  Eyes:     Extraocular Movements: Extraocular movements intact.     Conjunctiva/sclera: Conjunctivae normal.  Cardiovascular:     Rate and Rhythm: Normal rate and regular rhythm.  Pulmonary:     Effort: Pulmonary effort is normal.     Breath sounds: Normal breath sounds.  Abdominal:     General: Bowel sounds are normal.     Palpations: Abdomen is soft.  Musculoskeletal:        General: No swelling. Normal range of motion.     Cervical back: Normal range of motion and neck supple.  Skin:    General: Skin is warm and dry.     Coloration: Skin is not jaundiced.  Neurological:     General: No focal deficit present.     Mental Status: She is alert and oriented to person, place, and time.  Psychiatric:        Mood and Affect: Mood normal.        Behavior: Behavior normal.      Assessment: *Colon cancer screening *Family history of numerous colon polyps (sister with >30 polyps)  Plan: Will schedule for screening colonoscopy.The risks including infection, bleed, or perforation as well as benefits, limitations, alternatives and imponderables have been reviewed with the patient. Questions have been answered. All parties  agreeable.  Given her sister's history of >30 polyps, we will likely place her on a five year recall.    05/09/2020 11:47 AM   Disclaimer: This note was dictated with voice recognition software. Similar sounding words can inadvertently be transcribed and may not be corrected upon review.

## 2020-05-09 NOTE — Patient Instructions (Signed)
Schedule you for screening colonoscopy in office today.  Given your family history I recommend that we do these at least every 5 years.  Further recommendations to follow.  At [facility] we value your feedback. You may receive a survey about your visit today. Please share your experience as we strive to create trusting relationships with our patients to provide genuine, compassionate, quality care.  We appreciate your understanding and patience as we review any laboratory studies, imaging, and other diagnostic tests that are ordered as we care for you. Our office policy is 5 business days for review of these results, and any emergent or urgent results are addressed in a timely manner for your best interest. If you do not hear from our office in 1 week, please contact us.   We also encourage the use of MyChart, which contains your medical information for your review as well. If you are not enrolled in this feature, an access code is on this after visit summary for your convenience. Thank you for allowing [place] to be involved in your care.  It was great to see you today!  I hope you have a great rest of your fall!!    Kalev Temme K. Marletta Lor, D.O. Gastroenterology and Hepatology [facility]

## 2020-05-10 ENCOUNTER — Telehealth: Payer: Self-pay

## 2020-05-10 NOTE — Telephone Encounter (Signed)
Called pt to schedule TCS w/Prop w/Dr. Marletta Lor ASA 2. She will call office back to schedule TCS.

## 2020-05-14 NOTE — Telephone Encounter (Signed)
Pt hasn't called office to schedule procedure. Letter mailed.

## 2020-05-16 ENCOUNTER — Other Ambulatory Visit: Payer: Self-pay

## 2020-05-16 NOTE — Telephone Encounter (Signed)
Pt called office, TCS scheduled for 06/18/20 at 3:00pm. COVID test 06/14/20 at 1:00pm (per pt request). Orders entered. Appt letter and procedure instructions mailed.

## 2020-05-31 DIAGNOSIS — L720 Epidermal cyst: Secondary | ICD-10-CM | POA: Diagnosis not present

## 2020-06-13 ENCOUNTER — Ambulatory Visit: Payer: BC Managed Care – PPO | Admitting: Internal Medicine

## 2020-06-14 ENCOUNTER — Other Ambulatory Visit: Payer: Self-pay

## 2020-06-14 ENCOUNTER — Other Ambulatory Visit (HOSPITAL_COMMUNITY)
Admission: RE | Admit: 2020-06-14 | Discharge: 2020-06-14 | Disposition: A | Discharge status: Home / Self Care | Payer: BC Managed Care – PPO | Source: Ambulatory Visit | Attending: Internal Medicine | Admitting: Internal Medicine

## 2020-06-14 DIAGNOSIS — Z01812 Encounter for preprocedural laboratory examination: Secondary | ICD-10-CM | POA: Insufficient documentation

## 2020-06-14 DIAGNOSIS — Z20822 Contact with and (suspected) exposure to covid-19: Secondary | ICD-10-CM | POA: Insufficient documentation

## 2020-06-14 LAB — SARS CORONAVIRUS 2 (TAT 6-24 HRS): SARS Coronavirus 2: NEGATIVE

## 2020-06-18 ENCOUNTER — Ambulatory Visit (HOSPITAL_COMMUNITY): Payer: BC Managed Care – PPO | Admitting: Anesthesiology

## 2020-06-18 ENCOUNTER — Encounter (HOSPITAL_COMMUNITY): Payer: Self-pay

## 2020-06-18 ENCOUNTER — Ambulatory Visit (HOSPITAL_COMMUNITY)
Admission: RE | Admit: 2020-06-18 | Discharge: 2020-06-18 | Disposition: A | Discharge status: Home / Self Care | Payer: BC Managed Care – PPO | Attending: Internal Medicine | Admitting: Internal Medicine

## 2020-06-18 ENCOUNTER — Other Ambulatory Visit: Payer: Self-pay

## 2020-06-18 ENCOUNTER — Encounter (HOSPITAL_COMMUNITY)
Admission: RE | Disposition: A | Discharge status: Home / Self Care | Payer: Self-pay | Source: Home / Self Care | Attending: Internal Medicine

## 2020-06-18 DIAGNOSIS — Z1211 Encounter for screening for malignant neoplasm of colon: Secondary | ICD-10-CM | POA: Diagnosis not present

## 2020-06-18 DIAGNOSIS — K635 Polyp of colon: Secondary | ICD-10-CM | POA: Diagnosis not present

## 2020-06-18 DIAGNOSIS — Z87891 Personal history of nicotine dependence: Secondary | ICD-10-CM | POA: Diagnosis not present

## 2020-06-18 DIAGNOSIS — K648 Other hemorrhoids: Secondary | ICD-10-CM | POA: Diagnosis not present

## 2020-06-18 DIAGNOSIS — K6389 Other specified diseases of intestine: Secondary | ICD-10-CM | POA: Diagnosis not present

## 2020-06-18 DIAGNOSIS — K5730 Diverticulosis of large intestine without perforation or abscess without bleeding: Secondary | ICD-10-CM | POA: Insufficient documentation

## 2020-06-18 DIAGNOSIS — Z79899 Other long term (current) drug therapy: Secondary | ICD-10-CM | POA: Diagnosis not present

## 2020-06-18 HISTORY — PX: POLYPECTOMY: SHX5525

## 2020-06-18 HISTORY — PX: COLONOSCOPY WITH PROPOFOL: SHX5780

## 2020-06-18 SURGERY — COLONOSCOPY WITH PROPOFOL
Anesthesia: General

## 2020-06-18 MED ORDER — PROPOFOL 500 MG/50ML IV EMUL
INTRAVENOUS | Status: DC | PRN
  Administered 2020-06-18: 200 ug/kg/min via INTRAVENOUS

## 2020-06-18 MED ORDER — PROPOFOL 10 MG/ML IV BOLUS
INTRAVENOUS | Status: DC | PRN
  Administered 2020-06-18: 20 ug via INTRAVENOUS
  Administered 2020-06-18: 30 ug via INTRAVENOUS
  Administered 2020-06-18: 50 ug via INTRAVENOUS
  Administered 2020-06-18: 100 ug via INTRAVENOUS

## 2020-06-18 MED ORDER — STERILE WATER FOR IRRIGATION IR SOLN
Status: DC | PRN
  Administered 2020-06-18: 100 mL

## 2020-06-18 MED ORDER — LIDOCAINE HCL (CARDIAC) PF 100 MG/5ML IV SOSY
PREFILLED_SYRINGE | INTRAVENOUS | Status: DC | PRN
  Administered 2020-06-18: 50 mg via INTRATRACHEAL

## 2020-06-18 MED ORDER — PROPOFOL 10 MG/ML IV BOLUS
INTRAVENOUS | Status: AC
  Filled 2020-06-18: qty 20

## 2020-06-18 MED ORDER — LACTATED RINGERS IV SOLN: Freq: Once | INTRAVENOUS | Status: AC

## 2020-06-18 MED ORDER — LACTATED RINGERS IV SOLN: INTRAVENOUS | Status: DC | PRN

## 2020-06-18 NOTE — Discharge Instructions (Addendum)
Colonoscopy Discharge Instructions  Read the instructions outlined below and refer to this sheet in the next few weeks. These discharge instructions provide you with general information on caring for yourself after you leave the hospital. Your doctor may also give you specific instructions. While your treatment has been planned according to the most current medical practices available, unavoidable complications occasionally occur.   ACTIVITY  You may resume your regular activity, but move at a slower pace for the next 24 hours.   Take frequent rest periods for the next 24 hours.   Walking will help get rid of the air and reduce the bloated feeling in your belly (abdomen).   No driving for 24 hours (because of the medicine (anesthesia) used during the test).    Do not sign any important legal documents or operate any machinery for 24 hours (because of the anesthesia used during the test).  NUTRITION  Drink plenty of fluids.   You may resume your normal diet as instructed by your doctor.   Begin with a light meal and progress to your normal diet. Heavy or fried foods are harder to digest and may make you feel sick to your stomach (nauseated).   Avoid alcoholic beverages for 24 hours or as instructed.  MEDICATIONS  You may resume your normal medications unless your doctor tells you otherwise.  WHAT YOU CAN EXPECT TODAY  Some feelings of bloating in the abdomen.   Passage of more gas than usual.   Spotting of blood in your stool or on the toilet paper.  IF YOU HAD POLYPS REMOVED DURING THE COLONOSCOPY:  No aspirin products for 7 days or as instructed.   No alcohol for 7 days or as instructed.   Eat a soft diet for the next 24 hours.  FINDING OUT THE RESULTS OF YOUR TEST Not all test results are available during your visit. If your test results are not back during the visit, make an appointment with your caregiver to find out the results. Do not assume everything is normal if  you have not heard from your caregiver or the medical facility. It is important for you to follow up on all of your test results.  SEEK IMMEDIATE MEDICAL ATTENTION IF:  You have more than a spotting of blood in your stool.   Your belly is swollen (abdominal distention).   You are nauseated or vomiting.   You have a temperature over 101.   You have abdominal pain or discomfort that is severe or gets worse throughout the day.   Your colonoscopy revealed 1 polyp(s) which I removed successfully. Await pathology results, my office will contact you. I recommend repeating colonoscopy in 5 years for surveillance purposes. Follow up with GI as needed.   I hope you have a great rest of your week!  Charles K. Carver, D.O. Gastroenterology and Hepatology [facility]    Colon Polyps  Polyps are tissue growths inside the body. Polyps can grow in many places, including the large intestine (colon). A polyp may be a round bump or a mushroom-shaped growth. You could have one polyp or several. Most colon polyps are noncancerous (benign). However, some colon polyps can become cancerous over time. Finding and removing the polyps early can help prevent this. What are the causes? The exact cause of colon polyps is not known. What increases the risk? You are more likely to develop this condition if you:  Have a family history of colon cancer or colon polyps.  Are   older than 50 or older than 45 if you are African American.  Have inflammatory bowel disease, such as ulcerative colitis or Crohn's disease.  Have certain hereditary conditions, such as: ? Familial adenomatous polyposis. ? Lynch syndrome. ? Turcot syndrome. ? Peutz-Jeghers syndrome.  Are overweight.  Smoke cigarettes.  Do not get enough exercise.  Drink too much alcohol.  Eat a diet that is high in fat and red meat and low in fiber.  Had childhood cancer that was treated with abdominal  radiation. What are the signs or symptoms? Most polyps do not cause symptoms. If you have symptoms, they may include:  Blood coming from your rectum when having a bowel movement.  Blood in your stool. The stool may look dark red or black.  Abdominal pain.  A change in bowel habits, such as constipation or diarrhea. How is this diagnosed? This condition is diagnosed with a colonoscopy. This is a procedure in which a lighted, flexible scope is inserted into the anus and then passed into the colon to examine the area. Polyps are sometimes found when a colonoscopy is done as part of routine cancer screening tests. How is this treated? Treatment for this condition involves removing any polyps that are found. Most polyps can be removed during a colonoscopy. Those polyps will then be tested for cancer. Additional treatment may be needed depending on the results of testing. Follow these instructions at home: Lifestyle  Maintain a healthy weight, or lose weight if recommended by your health care provider.  Exercise every day or as told by your health care provider.  Do not use any products that contain nicotine or tobacco, such as cigarettes and e-cigarettes. If you need help quitting, ask your health care provider.  If you drink alcohol, limit how much you have: ? 0-1 drink a day for women. ? 0-2 drinks a day for men.  Be aware of how much alcohol is in your drink. In the [place] one drink equals one 12 oz bottle of beer (355 mL), one 5 oz glass of wine (148 mL), or one 1 oz shot of hard liquor (44 mL). Eating and drinking   Eat foods that are high in fiber, such as fruits, vegetables, and whole grains.  Eat foods that are high in calcium and vitamin D, such as milk, cheese, yogurt, eggs, liver, fish, and broccoli.  Limit foods that are high in fat, such as fried foods and desserts.  Limit the amount of red meat and processed meat you eat, such as hot dogs, sausage, bacon, and lunch  meats. General instructions  Keep all follow-up visits as told by your health care provider. This is important. ? This includes having regularly scheduled colonoscopies. ? Talk to your health care provider about when you need a colonoscopy. Contact a health care provider if:  You have new or worsening bleeding during a bowel movement.  You have new or increased blood in your stool.  You have a change in bowel habits.  You lose weight for no known reason. Summary  Polyps are tissue growths inside the body. Polyps can grow in many places, including the colon.  Most colon polyps are noncancerous (benign), but some can become cancerous over time.  This condition is diagnosed with a colonoscopy.  Treatment for this condition involves removing any polyps that are found. Most polyps can be removed during a colonoscopy. This information is not intended to replace advice given to you by your health care provider. Make sure you   discuss any questions you have with your health care provider. Document Revised: 10/01/2017 Document Reviewed: 10/01/2017 Elsevier Patient Education  2020 [employer]     Monitored Anesthesia Care, Care After These instructions provide you with information about caring for yourself after your procedure. Your health care provider may also give you more specific instructions. Your treatment has been planned according to current medical practices, but problems sometimes occur. Call your health care provider if you have any problems or questions after your procedure. What can I expect after the procedure? After your procedure, you may:  Feel sleepy for several hours.  Feel clumsy and have poor balance for several hours.  Feel forgetful about what happened after the procedure.  Have poor judgment for several hours.  Feel nauseous or vomit.  Have a sore throat if you had a breathing tube during the procedure. Follow these instructions at home: For at least  24 hours after the procedure:      Have a responsible adult stay with you. It is important to have someone help care for you until you are awake and alert.  Rest as needed.  Do not: ? Participate in activities in which you could fall or become injured. ? Drive. ? Use heavy machinery. ? Drink alcohol. ? Take sleeping pills or medicines that cause drowsiness. ? Make important decisions or sign legal documents. ? Take care of children on your own. Eating and drinking  Follow the diet that is recommended by your health care provider.  If you vomit, drink water, juice, or soup when you can drink without vomiting.  Make sure you have little or no nausea before eating solid foods. General instructions  Take over-the-counter and prescription medicines only as told by your health care provider.  If you have sleep apnea, surgery and certain medicines can increase your risk for breathing problems. Follow instructions from your health care provider about wearing your sleep device: ? Anytime you are sleeping, including during daytime naps. ? While taking prescription pain medicines, sleeping medicines, or medicines that make you drowsy.  If you smoke, do not smoke without supervision.  Keep all follow-up visits as told by your health care provider. This is important. Contact a health care provider if:  You keep feeling nauseous or you keep vomiting.  You feel light-headed.  You develop a rash.  You have a fever. Get help right away if:  You have trouble breathing. Summary  For several hours after your procedure, you may feel sleepy and have poor judgment.  Have a responsible adult stay with you for at least 24 hours or until you are awake and alert. This information is not intended to replace advice given to you by your health care provider. Make sure you discuss any questions you have with your health care provider. Document Revised: 09/14/2017 Document Reviewed:  10/07/2015 Elsevier Patient Education  2020 Elsevier Inc.  

## 2020-06-18 NOTE — Transfer of Care (Signed)
Immediate Anesthesia Transfer of Care Note  Patient: Alison Allen  Procedure(s) Performed: COLONOSCOPY WITH PROPOFOL (N/A ) POLYPECTOMY  Patient Location: PACU  Anesthesia Type:General  Level of Consciousness: awake, alert  and oriented  Airway & Oxygen Therapy: Patient Spontanous Breathing  Post-op Assessment: Report given to RN, Post -op Vital signs reviewed and stable and Patient moving all extremities X 4  Post vital signs: Reviewed and stable  Last Vitals:  Vitals Value Taken Time  BP 137/87 06/18/20 1454  Temp    Pulse 98 06/18/20 1454  Resp 18 06/18/20 1454  SpO2 100 % 06/18/20 1454    Last Pain:  Vitals:   06/18/20 1454  TempSrc: Oral  PainSc: 0-No pain      Patients Stated Pain Goal: 5 (06/18/20 1304)  Complications: No complications documented.

## 2020-06-18 NOTE — Anesthesia Postprocedure Evaluation (Signed)
Anesthesia Post Note  Patient: Alison Allen  Procedure(s) Performed: COLONOSCOPY WITH PROPOFOL (N/A ) POLYPECTOMY  Patient location during evaluation: Endoscopy Anesthesia Type: General Level of consciousness: awake and alert Pain management: pain level controlled Vital Signs Assessment: post-procedure vital signs reviewed and stable Respiratory status: spontaneous breathing, nonlabored ventilation, respiratory function stable and patient connected to nasal cannula oxygen Cardiovascular status: blood pressure returned to baseline and stable Postop Assessment: no apparent nausea or vomiting Anesthetic complications: no   No complications documented.   Last Vitals:  Vitals:   06/18/20 1318 06/18/20 1454  BP: (!) 171/96 137/87  Pulse: 97 98  Resp: 15 18  Temp: 36.8 C   SpO2: 100% 100%    Last Pain:  Vitals:   06/18/20 1454  TempSrc: Oral  PainSc: 0-No pain                 Blythe Stanford

## 2020-06-18 NOTE — H&P (Signed)
Primary Care Physician:  Elfredia Nevins, MD Primary Gastroenterologist:  Dr. Marletta Lor  Pre-Procedure History & Physical: HPI:  Alison Allen is a 64 y.o. female is here for a colonoscopy for colon cancer screening purposes.  Patient denies any family history of colorectal cancer.  No melena or hematochezia.  No abdominal pain or unintentional weight loss.  No change in bowel habits.  Overall feels well from a GI standpoint.  Past Medical History:  Diagnosis Date  . Anxiety   . Hypertension     Past Surgical History:  Procedure Laterality Date  . NOVASURE ABLATION    . TUBAL LIGATION      Prior to Admission medications   Medication Sig Start Date End Date Taking? Authorizing Provider  alprazolam Prudy Feeler) 2 MG tablet Take 2 mg by mouth in the morning, at noon, in the evening, and at bedtime.   Yes [provider]  CALCIUM-VITAMIN D PO Take 1 tablet by mouth daily.    [provider]  Chlorpheniramine-Phenylephrine 4-10 MG tablet Take 1 tablet by mouth daily. [facility]    [provider]  enalapril (VASOTEC) 20 MG tablet Take 20 mg by mouth daily.    [provider]  hydrochlorothiazide (HYDRODIURIL) 25 MG tablet Take 25 mg by mouth daily.    [provider]  Multiple Vitamins-Minerals (MULTIVITAMIN WITH MINERALS) tablet Take 1 tablet by mouth daily.    [provider]    Allergies as of 05/16/2020  . (No Known Allergies)    Family History  Problem Relation Age of Onset  . Dementia Mother        deceased at age 88  . Stroke Father 69       deceased due to stroke  . Colon polyps Sister        in mid 43s with 16 adenomas    Social History   Socioeconomic History  . Marital status: Married    Spouse name: Not on file  . Number of children: Not on file  . Years of education: Not on file  . Highest education level: Not on file  Occupational History  . Not on file  Tobacco Use  . Smoking status: Former  Smoker    Types: Cigarettes  . Smokeless tobacco: Never Used  Vaping Use  . Vaping Use: Never used  Substance and Sexual Activity  . Alcohol use: Yes    Comment: occas  . Drug use: Never  . Sexual activity: Not on file  Other Topics Concern  . Not on file  Social History Narrative  . Not on file   Social Determinants of Health   Financial Resource Strain: Not on file  Food Insecurity: Not on file  Transportation Needs: Not on file  Physical Activity: Not on file  Stress: Not on file  Social Connections: Not on file  Intimate Partner Violence: Not on file    Review of Systems: See HPI, otherwise negative ROS  Impression/Plan: Alison Allen is here for a colonoscopy to be performed for colon cancer screening purposes.  The risks of the procedure including infection, bleed, or perforation as well as benefits, limitations, alternatives and imponderables have been reviewed with the patient. Questions have been answered. All parties agreeable.

## 2020-06-18 NOTE — Anesthesia Preprocedure Evaluation (Addendum)
Anesthesia Evaluation  Patient identified by MRN, date of birth, ID band Patient awake    Reviewed: Allergy & Precautions, NPO status , Patient's Chart, lab work & pertinent test results  History of Anesthesia Complications Negative for: history of anesthetic complications  Airway Mallampati: III  TM Distance: >3 FB Neck ROM: Full    Dental  (+) Dental Advisory Given Double bone,inflamed all the time:   Pulmonary former smoker,    Pulmonary exam normal breath sounds clear to auscultation       Cardiovascular Exercise Tolerance: Good hypertension, Pt. on medications Normal cardiovascular exam Rhythm:Regular Rate:Normal     Neuro/Psych Anxiety    GI/Hepatic negative GI ROS, (+)     substance abuse  alcohol use,   Endo/Other  negative endocrine ROS  Renal/GU negative Renal ROS  negative genitourinary   Musculoskeletal negative musculoskeletal ROS (+)   Abdominal   Peds negative pediatric ROS (+)  Hematology negative hematology ROS (+)   Anesthesia Other Findings   Reproductive/Obstetrics negative OB ROS                            Anesthesia Physical Anesthesia Plan  ASA: II  Anesthesia Plan: General   Post-op Pain Management:    Induction: Intravenous  PONV Risk Score and Plan: TIVA  Airway Management Planned: Nasal Cannula and Natural Airway  Additional Equipment:   Intra-op Plan:   Post-operative Plan:   Informed Consent: I have reviewed the patients History and Physical, chart, labs and discussed the procedure including the risks, benefits and alternatives for the proposed anesthesia with the patient or authorized representative who has indicated his/her understanding and acceptance.     Dental advisory given  Plan Discussed with: CRNA and Surgeon  Anesthesia Plan Comments:         Anesthesia Quick Evaluation

## 2020-06-18 NOTE — Op Note (Signed)
[facility] Patient Name: Alison Allen Procedure Date: 06/18/2020 1:59 PM MRN: [id number] Date of Birth: [birth year] Attending MD: Elon Alas. Abbey Chatters DO CSN: [id number] Age: 64 Admit Type: Outpatient Procedure:                Colonoscopy Indications:              Screening for colorectal malignant neoplasm Providers:                Elon Alas. Abbey Chatters, DO, Lambert Mody, Nelma Rothman, Technician, Casimer Bilis, Technician Referring MD:              Medicines:                See the Anesthesia note for documentation of the                            administered medications Complications:            No immediate complications. Estimated Blood Loss:     Estimated blood loss was minimal. Procedure:                Pre-Anesthesia Assessment:                           - The anesthesia plan was to use monitored                            anesthesia care (MAC).                           After obtaining informed consent, the colonoscope                            was passed under direct vision. Throughout the                            procedure, the patient's blood pressure, pulse, and                            oxygen saturations were monitored continuously. The                            [id number]-[id number] ([id number]) scope was introduced through                            the anus and advanced to the the cecum, identified                            by appendiceal orifice and ileocecal valve. The                            colonoscopy was performed without difficulty. The                            patient tolerated the  procedure well. The quality                            of the bowel preparation was evaluated using the                            BBPS [facility] Bowel Preparation Scale) with scores                            of: Right Colon = 2 (minor amount of residual                            staining, small fragments of stool and/or opaque                             liquid, but mucosa seen well), Transverse Colon = 3                            (entire mucosa seen well with no residual staining,                            small fragments of stool or opaque liquid) and Left                            Colon = 3 (entire mucosa seen well with no residual                            staining, small fragments of stool or opaque                            liquid). The total BBPS score equals 8. The quality                            of the bowel preparation was good. Scope In: 2:25:52 PM Scope Out: 2:50:50 PM Scope Withdrawal Time: 0 hours 13 minutes 14 seconds  Total Procedure Duration: 0 hours 24 minutes 58 seconds  Findings:      The perianal and digital rectal examinations were normal.      Non-bleeding internal hemorrhoids were found during endoscopy.      Multiple small-mouthed diverticula were found in the sigmoid colon and       descending colon.      A 8 mm polyp was found in the sigmoid colon. The polyp was       semi-pedunculated. The polyp was removed with a cold snare. Resection       and retrieval were complete.      The exam was otherwise without abnormality. Impression:               - Non-bleeding internal hemorrhoids.                           - Diverticulosis in the sigmoid colon and in the  descending colon.                           - One 8 mm polyp in the sigmoid colon, removed with                            a cold snare. Resected and retrieved.                           - The examination was otherwise normal. Moderate Sedation:      Per Anesthesia Care Recommendation:           - Patient has a contact number available for                            emergencies. The signs and symptoms of potential                            delayed complications were discussed with the                            patient. Return to normal activities tomorrow.                            Written discharge instructions  were provided to the                            patient.                           - Resume previous diet.                           - Continue present medications.                           - Await pathology results.                           - Repeat colonoscopy in 5 years for surveillance.                           - Return to GI clinic PRN. Procedure Code(s):        --- Professional ---                           [phone], Colonoscopy, flexible; with removal of                            tumor(s), polyp(s), or other lesion(s) by snare                            technique Diagnosis Code(s):        --- Professional ---                           Z12.11, Encounter for screening  for malignant                            neoplasm of colon                           K64.8, Other hemorrhoids                           K63.5, Polyp of colon                           K57.30, Diverticulosis of large intestine without                            perforation or abscess without bleeding CPT copyright 2019 American Medical Association. All rights reserved. The codes documented in this report are preliminary and upon coder review may  be revised to meet current compliance requirements. Elon Alas. Abbey Chatters, DO [place] Abbey Chatters, DO 06/18/2020 3:04:49 PM This report has been signed electronically. Number of Addenda: 0

## 2020-06-21 LAB — SURGICAL PATHOLOGY

## 2020-06-26 ENCOUNTER — Encounter (HOSPITAL_COMMUNITY): Payer: Self-pay | Admitting: Internal Medicine

## 2020-07-11 DIAGNOSIS — F419 Anxiety disorder, unspecified: Secondary | ICD-10-CM | POA: Diagnosis not present

## 2020-07-11 DIAGNOSIS — Z13220 Encounter for screening for lipoid disorders: Secondary | ICD-10-CM | POA: Diagnosis not present

## 2020-07-11 DIAGNOSIS — Z681 Body mass index (BMI) 19 or less, adult: Secondary | ICD-10-CM | POA: Diagnosis not present

## 2020-07-11 DIAGNOSIS — R7309 Other abnormal glucose: Secondary | ICD-10-CM | POA: Diagnosis not present

## 2020-07-11 DIAGNOSIS — M2012 Hallux valgus (acquired), left foot: Secondary | ICD-10-CM | POA: Diagnosis not present

## 2020-07-11 DIAGNOSIS — E785 Hyperlipidemia, unspecified: Secondary | ICD-10-CM | POA: Diagnosis not present

## 2020-07-11 DIAGNOSIS — I10 Essential (primary) hypertension: Secondary | ICD-10-CM | POA: Diagnosis not present

## 2020-07-11 DIAGNOSIS — E063 Autoimmune thyroiditis: Secondary | ICD-10-CM | POA: Diagnosis not present

## 2020-07-12 ENCOUNTER — Other Ambulatory Visit (HOSPITAL_COMMUNITY): Payer: Self-pay | Admitting: Physician Assistant

## 2020-07-12 DIAGNOSIS — Z1231 Encounter for screening mammogram for malignant neoplasm of breast: Secondary | ICD-10-CM

## 2020-08-13 DIAGNOSIS — M2012 Hallux valgus (acquired), left foot: Secondary | ICD-10-CM | POA: Diagnosis not present

## 2020-08-13 DIAGNOSIS — M79675 Pain in left toe(s): Secondary | ICD-10-CM | POA: Diagnosis not present

## 2020-09-05 ENCOUNTER — Ambulatory Visit (HOSPITAL_COMMUNITY)
Admission: RE | Admit: 2020-09-05 | Discharge: 2020-09-05 | Disposition: A | Discharge status: Home / Self Care | Payer: BC Managed Care – PPO | Source: Ambulatory Visit | Attending: Physician Assistant | Admitting: Physician Assistant

## 2020-09-05 DIAGNOSIS — Z1231 Encounter for screening mammogram for malignant neoplasm of breast: Secondary | ICD-10-CM | POA: Diagnosis not present

## 2020-09-18 ENCOUNTER — Encounter: Payer: Self-pay | Admitting: Gastroenterology

## 2020-09-18 ENCOUNTER — Ambulatory Visit: Payer: BC Managed Care – PPO | Admitting: Gastroenterology

## 2020-10-17 DIAGNOSIS — Z681 Body mass index (BMI) 19 or less, adult: Secondary | ICD-10-CM | POA: Diagnosis not present

## 2020-10-17 DIAGNOSIS — F419 Anxiety disorder, unspecified: Secondary | ICD-10-CM | POA: Diagnosis not present

## 2020-10-17 DIAGNOSIS — L709 Acne, unspecified: Secondary | ICD-10-CM | POA: Diagnosis not present

## 2020-10-22 ENCOUNTER — Encounter (HOSPITAL_COMMUNITY): Payer: BC Managed Care – PPO

## 2020-10-22 ENCOUNTER — Other Ambulatory Visit (HOSPITAL_COMMUNITY): Payer: BC Managed Care – PPO

## 2020-10-24 ENCOUNTER — Ambulatory Visit: Admit: 2020-10-24 | Payer: BC Managed Care – PPO

## 2020-10-24 SURGERY — BUNIONECTOMY
Anesthesia: Monitor Anesthesia Care | Site: Toe | Laterality: Left

## 2021-01-24 DIAGNOSIS — Z681 Body mass index (BMI) 19 or less, adult: Secondary | ICD-10-CM | POA: Diagnosis not present

## 2021-01-24 DIAGNOSIS — Z1331 Encounter for screening for depression: Secondary | ICD-10-CM | POA: Diagnosis not present

## 2021-01-24 DIAGNOSIS — F419 Anxiety disorder, unspecified: Secondary | ICD-10-CM | POA: Diagnosis not present

## 2021-01-24 DIAGNOSIS — I10 Essential (primary) hypertension: Secondary | ICD-10-CM | POA: Diagnosis not present

## 2021-01-24 DIAGNOSIS — E063 Autoimmune thyroiditis: Secondary | ICD-10-CM | POA: Diagnosis not present

## 2021-07-15 DIAGNOSIS — Z6820 Body mass index (BMI) 20.0-20.9, adult: Secondary | ICD-10-CM | POA: Diagnosis not present

## 2021-07-15 DIAGNOSIS — Z1331 Encounter for screening for depression: Secondary | ICD-10-CM | POA: Diagnosis not present

## 2021-07-15 DIAGNOSIS — M67432 Ganglion, left wrist: Secondary | ICD-10-CM | POA: Diagnosis not present

## 2021-07-15 DIAGNOSIS — F419 Anxiety disorder, unspecified: Secondary | ICD-10-CM | POA: Diagnosis not present

## 2021-08-26 DIAGNOSIS — M67432 Ganglion, left wrist: Secondary | ICD-10-CM | POA: Diagnosis not present

## 2021-08-26 DIAGNOSIS — Z6821 Body mass index (BMI) 21.0-21.9, adult: Secondary | ICD-10-CM | POA: Diagnosis not present

## 2021-08-26 DIAGNOSIS — J309 Allergic rhinitis, unspecified: Secondary | ICD-10-CM | POA: Diagnosis not present

## 2021-12-04 DIAGNOSIS — F419 Anxiety disorder, unspecified: Secondary | ICD-10-CM | POA: Diagnosis not present

## 2021-12-04 DIAGNOSIS — Z6821 Body mass index (BMI) 21.0-21.9, adult: Secondary | ICD-10-CM | POA: Diagnosis not present

## 2021-12-04 DIAGNOSIS — I10 Essential (primary) hypertension: Secondary | ICD-10-CM | POA: Diagnosis not present

## 2021-12-04 DIAGNOSIS — M67432 Ganglion, left wrist: Secondary | ICD-10-CM | POA: Diagnosis not present

## 2021-12-04 DIAGNOSIS — E063 Autoimmune thyroiditis: Secondary | ICD-10-CM | POA: Diagnosis not present

## 2022-04-21 DIAGNOSIS — J309 Allergic rhinitis, unspecified: Secondary | ICD-10-CM | POA: Diagnosis not present

## 2022-04-21 DIAGNOSIS — I10 Essential (primary) hypertension: Secondary | ICD-10-CM | POA: Diagnosis not present

## 2022-04-21 DIAGNOSIS — F419 Anxiety disorder, unspecified: Secondary | ICD-10-CM | POA: Diagnosis not present

## 2022-04-21 DIAGNOSIS — E063 Autoimmune thyroiditis: Secondary | ICD-10-CM | POA: Diagnosis not present

## 2022-04-21 DIAGNOSIS — Z6821 Body mass index (BMI) 21.0-21.9, adult: Secondary | ICD-10-CM | POA: Diagnosis not present

## 2022-09-18 DIAGNOSIS — J309 Allergic rhinitis, unspecified: Secondary | ICD-10-CM | POA: Diagnosis not present

## 2022-09-18 DIAGNOSIS — I10 Essential (primary) hypertension: Secondary | ICD-10-CM | POA: Diagnosis not present

## 2022-09-18 DIAGNOSIS — Z1331 Encounter for screening for depression: Secondary | ICD-10-CM | POA: Diagnosis not present

## 2022-09-18 DIAGNOSIS — Z6820 Body mass index (BMI) 20.0-20.9, adult: Secondary | ICD-10-CM | POA: Diagnosis not present

## 2022-09-18 DIAGNOSIS — E538 Deficiency of other specified B group vitamins: Secondary | ICD-10-CM | POA: Diagnosis not present

## 2022-09-18 DIAGNOSIS — E559 Vitamin D deficiency, unspecified: Secondary | ICD-10-CM | POA: Diagnosis not present

## 2022-09-18 DIAGNOSIS — E063 Autoimmune thyroiditis: Secondary | ICD-10-CM | POA: Diagnosis not present

## 2022-09-18 DIAGNOSIS — Z0001 Encounter for general adult medical examination with abnormal findings: Secondary | ICD-10-CM | POA: Diagnosis not present

## 2022-09-18 DIAGNOSIS — R7989 Other specified abnormal findings of blood chemistry: Secondary | ICD-10-CM | POA: Diagnosis not present

## 2022-09-19 ENCOUNTER — Other Ambulatory Visit (HOSPITAL_COMMUNITY): Payer: Self-pay | Admitting: Internal Medicine

## 2022-09-19 DIAGNOSIS — R7989 Other specified abnormal findings of blood chemistry: Secondary | ICD-10-CM

## 2022-10-03 ENCOUNTER — Ambulatory Visit (HOSPITAL_COMMUNITY)
Admission: RE | Admit: 2022-10-03 | Discharge: 2022-10-03 | Disposition: A | Discharge status: Home / Self Care | Payer: BC Managed Care – PPO | Source: Ambulatory Visit | Attending: Internal Medicine | Admitting: Internal Medicine

## 2022-10-03 ENCOUNTER — Other Ambulatory Visit (HOSPITAL_COMMUNITY): Payer: Self-pay | Admitting: Internal Medicine

## 2022-10-03 DIAGNOSIS — R7989 Other specified abnormal findings of blood chemistry: Secondary | ICD-10-CM | POA: Diagnosis not present

## 2022-10-03 DIAGNOSIS — R945 Abnormal results of liver function studies: Secondary | ICD-10-CM | POA: Diagnosis not present

## 2022-10-03 DIAGNOSIS — Z1231 Encounter for screening mammogram for malignant neoplasm of breast: Secondary | ICD-10-CM

## 2022-10-15 ENCOUNTER — Encounter (HOSPITAL_COMMUNITY): Payer: Self-pay

## 2022-10-15 ENCOUNTER — Ambulatory Visit (HOSPITAL_COMMUNITY)
Admission: RE | Admit: 2022-10-15 | Discharge: 2022-10-15 | Disposition: A | Discharge status: Home / Self Care | Payer: BC Managed Care – PPO | Source: Ambulatory Visit | Attending: Internal Medicine | Admitting: Internal Medicine

## 2022-10-15 DIAGNOSIS — Z1231 Encounter for screening mammogram for malignant neoplasm of breast: Secondary | ICD-10-CM | POA: Diagnosis not present

## 2022-11-17 ENCOUNTER — Ambulatory Visit: Payer: BC Managed Care – PPO | Admitting: Gastroenterology

## 2022-11-17 ENCOUNTER — Encounter: Payer: Self-pay | Admitting: Gastroenterology

## 2022-11-17 VITALS — BP 134/83 | HR 91 | Temp 98.0°F | Ht 66.0 in | Wt 133.6 lb

## 2022-11-17 DIAGNOSIS — R7989 Other specified abnormal findings of blood chemistry: Secondary | ICD-10-CM | POA: Diagnosis not present

## 2022-11-17 NOTE — Progress Notes (Signed)
GI Office Note    Referring Provider: [facility] Primary Care Physician:  Elfredia Nevins, MD  Primary Gastroenterologist: Hennie Duos. Marletta Lor, DO   Chief Complaint   Chief Complaint  Patient presents with   Abnormal [place]     History of Present Illness   Alison Allen is a 67 y.o. female presenting today for further evaluation of of liver lesion.  Patient last seen in our office in November 2021.  Recent labs from March 2024 with mild elevation of LFTs.  Total bilirubin 0.3, alkaline phosphatase slightly elevated at 122, AST slightly elevated at 42, ALT slightly elevated at 39.  January 2022, LFTs normal.  TSH, vitamin D, vitamin B12, folate, GFR, CBC unremarkable.  Abdominal ultrasound October 03, 2022: Echogenic lesion measuring 2.1 cm typical for cavernous hemangioma right lobe of the liver.    Patient notes that her sister had history of lymphoma in the 28s.  Received chemotherapy.  Contracted hepatitis C from blood transfusions.  Treated for hepatitis C in 12-03-2016.  She did develop liver issues, including HCC, at 1 point was on transplant list.  She died at age 9, in 12-04-19.  She also had a history of numerous colon polyps, per patient she had 132. No family history of colon cancer.  Patient denies any abdominal pain, nausea or vomiting, heartburn, constipation, diarrhea, melena, rectal bleeding.  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  Denies any significant alcohol use.  Occasionally has a couple of beers but nothing on a routine basis.  Is any herbal medications.  Colonoscopy December 2021: -Nonbleeding internal hemorrhoids -Diverticulosis -one 8 mm polyp in the sigmoid colon, no adenomatous features identified.  Medications   Current Outpatient Medications  Medication Sig Dispense Refill   alprazolam (XANAX) 2 MG tablet Take 2 mg by mouth in the morning, at noon, in the  evening, and at bedtime.     CALCIUM-VITAMIN D PO Take 1 tablet by mouth daily.     Chlorpheniramine-Phenylephrine 4-10 MG tablet Take 1 tablet by mouth daily. [facility]     clindamycin (CLINDAGEL) 1 % gel Apply topically.     enalapril (VASOTEC) 20 MG tablet Take 20 mg by mouth daily.     hydrochlorothiazide (HYDRODIURIL) 25 MG tablet Take 25 mg by mouth daily.     Multiple Vitamins-Minerals (MULTIVITAMIN WITH MINERALS) tablet Take 1 tablet by mouth daily.     No current facility-administered medications for this visit.    Allergies   Allergies as of 11/17/2022   (No Known Allergies)    Past Medical History   Past Medical History:  Diagnosis Date   Anxiety    Hypertension     Past Surgical History   Past Surgical History:  Procedure Laterality Date   COLONOSCOPY WITH PROPOFOL N/A 06/18/2020   Procedure: COLONOSCOPY WITH PROPOFOL;  Surgeon: Lanelle Bal, DO;  [facility];  Service: Endoscopy;  Laterality: N/A;  3:00pm   NOVASURE ABLATION     POLYPECTOMY  06/18/2020   Procedure: POLYPECTOMY;  Surgeon: Lanelle Bal, DO;  [facility];  Service: Endoscopy;;   TUBAL LIGATION      Past Family History   Family History  Problem Relation Age of Onset   Dementia Mother        deceased at age 37   Stroke Father 60  deceased due to stroke   Colon polyps Sister        in mid 61s with 24 adenomas    Past Social History   Social History   Socioeconomic History   Marital status: Widowed    Spouse name: Not on file   Number of children: Not on file   Years of education: Not on file   Highest education level: Not on file  Occupational History   Not on file  Tobacco Use   Smoking status: Former    Types: Cigarettes   Smokeless tobacco: Never  Vaping Use   Vaping Use: Never used  Substance and Sexual Activity   Alcohol use: Not Currently    Comment: occas   Drug use: Never   Sexual activity: Yes  Other Topics  Concern   Not on file  Social History Narrative   Not on file   Social Determinants of Health   Financial Resource Strain: Not on file  Food Insecurity: Not on file  Transportation Needs: Not on file  Physical Activity: Not on file  Stress: Not on file  Social Connections: Not on file  Intimate Partner Violence: Not on file    Review of Systems   General: Negative for anorexia, weight loss, fever, chills, fatigue, weakness. Eyes: Negative for vision changes.  ENT: Negative for hoarseness, difficulty swallowing , nasal congestion. CV: Negative for chest pain, angina, palpitations, dyspnea on exertion, peripheral edema.  Respiratory: Negative for dyspnea at rest, dyspnea on exertion, cough, sputum, wheezing.  GI: See history of present illness. GU:  Negative for dysuria, hematuria, urinary incontinence, urinary frequency, nocturnal urination.  MS: Negative for joint pain, low back pain.  Derm: Negative for rash or itching.  Neuro: Negative for weakness, abnormal sensation, seizure, frequent headaches, memory loss,  confusion.  Psych: Negative for anxiety, depression, suicidal ideation, hallucinations.  Endo: Negative for unusual weight change.  Heme: Negative for bruising or bleeding. Allergy: Negative for rash or hives.  Physical Exam   BP 134/83 (BP Location: Right Arm, Patient Position: Sitting, Cuff Size: Normal)   Pulse 91   Temp 98 F (36.7 C) (Oral)   Ht 5\' 6"  (1.676 m)   Wt 133 lb 9.6 oz (60.6 kg)   LMP  (LMP Unknown)   SpO2 97%   BMI 21.56 kg/m    General: Well-nourished, well-developed in no acute distress.  Head: Normocephalic, atraumatic.   Eyes: Conjunctiva pink, no icterus. Mouth: Oropharyngeal mucosa moist and pink  Neck: Supple without thyromegaly, masses, or lymphadenopathy.  Lungs: Clear to auscultation bilaterally.  Heart: Regular rate and rhythm, no murmurs rubs or gallops.  Abdomen: Bowel sounds are normal, nontender, nondistended, no  hepatosplenomegaly or masses,  no abdominal bruits or hernia, no rebound or guarding.   Rectal: Not performed Extremities: No lower extremity edema. No clubbing or deformities.  Neuro: Alert and oriented x 4 , grossly normal neurologically.  Skin: Warm and dry, no rash or jaundice.   Psych: Alert and cooperative, normal mood and affect.  Labs   September 19, 2022: White blood cell count 6400, hemoglobin 14.5, hematocrit 42.1, MCV 97, platelets 349,000, BUN 5, creatinine 0.67, sodium 134, potassium 3.7, albumin 4.5, total bilirubin 0.3, alkaline phosphatase 122, AST 42, ALT 39, B12 465, folate 7.4, TSH 3.170, vitamin D 61.  Imaging Studies   No results found.  Assessment   Elevated LFTs: very mildly elevated alkphos, AST, ALT. Liver u/s with benign hemangioma but significant findings. Possibility of drug induced with  xanax, vasotec. Recommend further serologies for evaluation.   FH colonic adenomas: sister had numerous adenomatous colon polyps at age less than 19. Patient should have high risk screening colonoscopy every five years. Will change recall date for next colonoscopy to 05/2025.   PLAN   Labs in near future.    Leanna Battles. Melvyn Neth, MHS, PA-C [facility]

## 2022-11-17 NOTE — Patient Instructions (Signed)
Further labs needed to evaluate abnormal liver labs. Please go to [facility] at your convenience. Take orders with you. We will be in touch with results as available.

## 2022-11-21 DIAGNOSIS — R7989 Other specified abnormal findings of blood chemistry: Secondary | ICD-10-CM | POA: Diagnosis not present

## 2022-11-24 LAB — HEPATITIS B SURFACE ANTIGEN: Hepatitis B Surface Ag: NEGATIVE

## 2022-11-24 LAB — IGG, IGA, IGM
IgA/Immunoglobulin A, Serum: 218 mg/dL (ref 87–352)
IgG (Immunoglobin G), Serum: 946 mg/dL (ref 586–1602)
IgM (Immunoglobulin M), Srm: 114 mg/dL (ref 26–217)

## 2022-11-24 LAB — IRON,TIBC AND FERRITIN PANEL
Ferritin: 635 ng/mL — ABNORMAL HIGH (ref 15–150)
Iron Saturation: 42 % (ref 15–55)
Iron: 97 ug/dL (ref 27–139)
Total Iron Binding Capacity: 233 ug/dL — ABNORMAL LOW (ref 250–450)
UIBC: 136 ug/dL (ref 118–369)

## 2022-11-24 LAB — HEPATIC FUNCTION PANEL
ALT: 17 IU/L (ref 0–32)
AST: 21 IU/L (ref 0–40)
Albumin: 4.4 g/dL (ref 3.9–4.9)
Alkaline Phosphatase: 111 IU/L (ref 44–121)
Bilirubin Total: 0.4 mg/dL (ref 0.0–1.2)
Bilirubin, Direct: 0.12 mg/dL (ref 0.00–0.40)
Total Protein: 7.1 g/dL (ref 6.0–8.5)

## 2022-11-24 LAB — HEPATITIS A ANTIBODY, TOTAL: hep A Total Ab: NEGATIVE

## 2022-11-24 LAB — TISSUE TRANSGLUTAMINASE, IGA: Transglutaminase IgA: <2 U/mL (ref 0–3)

## 2022-11-24 LAB — HEPATITIS B SURFACE ANTIBODY,QUALITATIVE: Hep B Surface Ab, Qual: NONREACTIVE

## 2022-11-24 LAB — MITOCHONDRIAL/SMOOTH MUSCLE AB PNL
Mitochondrial Ab: <20 Units (ref 0.0–20.0)
Smooth Muscle Ab: 17 Units (ref 0–19)

## 2022-11-24 LAB — HEPATITIS B CORE ANTIBODY, TOTAL: Hep B Core Total Ab: NEGATIVE

## 2022-11-24 LAB — HEPATITIS C ANTIBODY: Hep C Virus Ab: NONREACTIVE

## 2022-11-24 LAB — ANA: Anti Nuclear Antibody (ANA): NEGATIVE

## 2022-12-01 ENCOUNTER — Other Ambulatory Visit: Payer: Self-pay

## 2022-12-01 DIAGNOSIS — R7989 Other specified abnormal findings of blood chemistry: Secondary | ICD-10-CM

## 2022-12-05 DIAGNOSIS — I10 Essential (primary) hypertension: Secondary | ICD-10-CM | POA: Diagnosis not present

## 2022-12-05 DIAGNOSIS — F419 Anxiety disorder, unspecified: Secondary | ICD-10-CM | POA: Diagnosis not present

## 2022-12-05 DIAGNOSIS — J309 Allergic rhinitis, unspecified: Secondary | ICD-10-CM | POA: Diagnosis not present

## 2022-12-05 DIAGNOSIS — R7989 Other specified abnormal findings of blood chemistry: Secondary | ICD-10-CM | POA: Diagnosis not present

## 2022-12-05 DIAGNOSIS — Z6821 Body mass index (BMI) 21.0-21.9, adult: Secondary | ICD-10-CM | POA: Diagnosis not present

## 2023-02-23 ENCOUNTER — Other Ambulatory Visit: Payer: Self-pay

## 2023-02-23 DIAGNOSIS — R7989 Other specified abnormal findings of blood chemistry: Secondary | ICD-10-CM

## 2023-03-09 IMAGING — MG MM DIGITAL SCREENING BILAT W/ TOMO AND CAD
8 series · 9 of 24 positions shown · non-contrast
Comparison: Previous exam(s).

CLINICAL DATA: Screening.

EXAM:
DIGITAL SCREENING BILATERAL MAMMOGRAM WITH TOMOSYNTHESIS AND CAD
TECHNIQUE: Bilateral screening digital craniocaudal and mediolateral oblique
mammograms were obtained. Bilateral screening digital breast
tomosynthesis was performed. The images were evaluated with
computer-aided detection.

[R CC synth-2D]
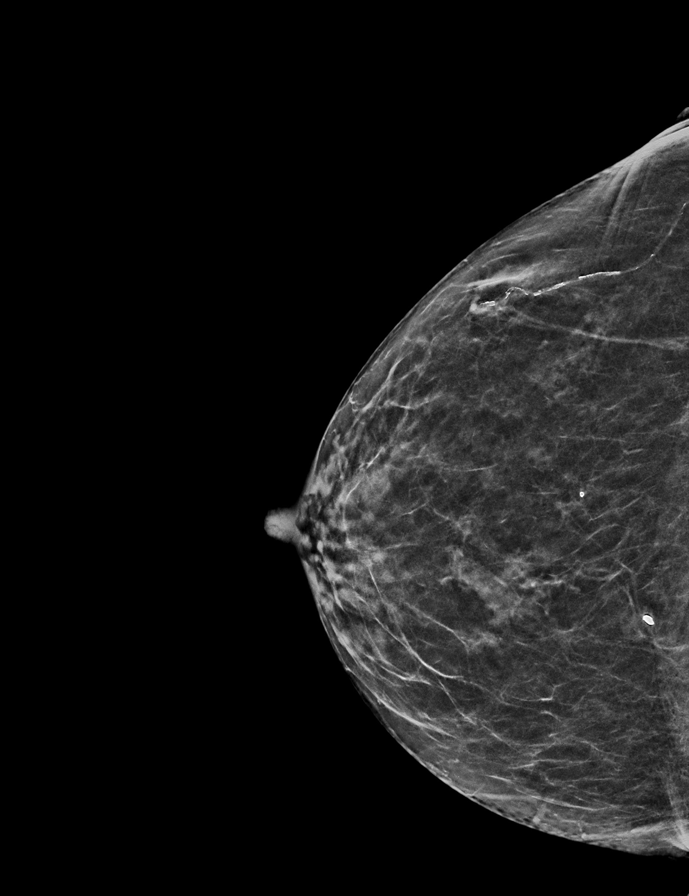

[R MLO synth-2D]
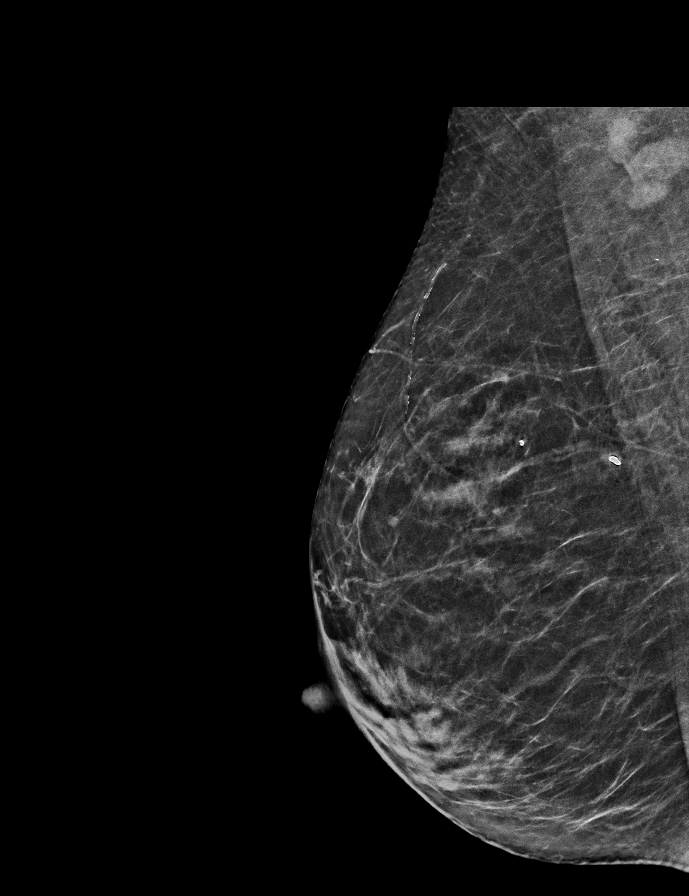

[L MLO synth-2D]
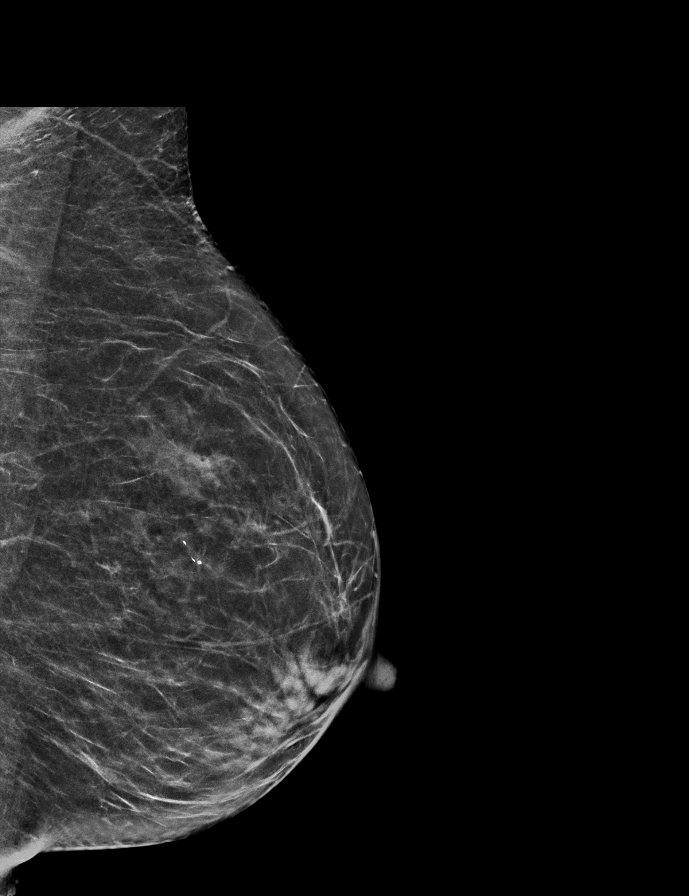

[L CC synth-2D]
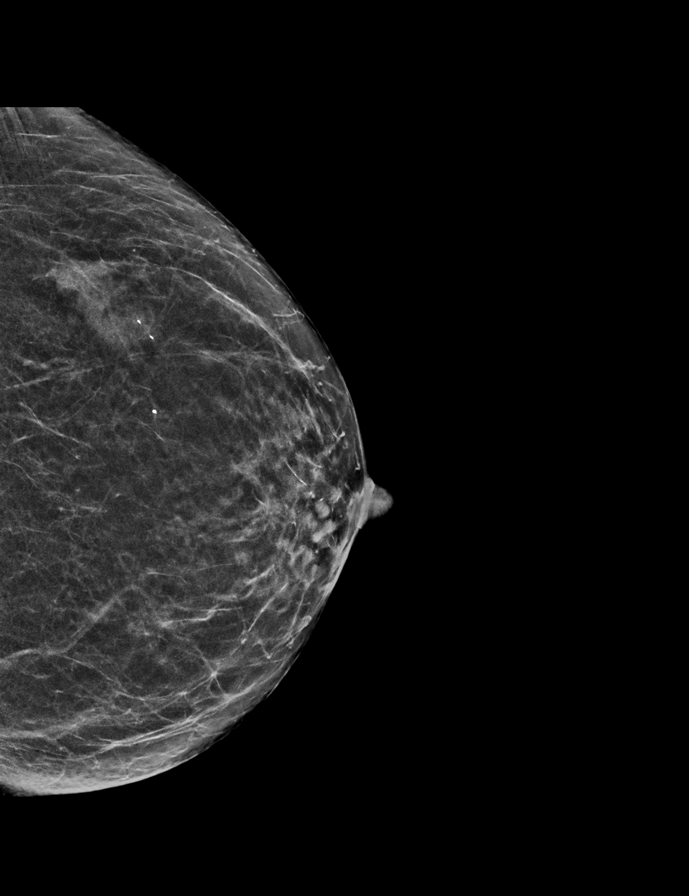

[R MLO tomo · 2 of 52 frames shown]
[frame 17/52]
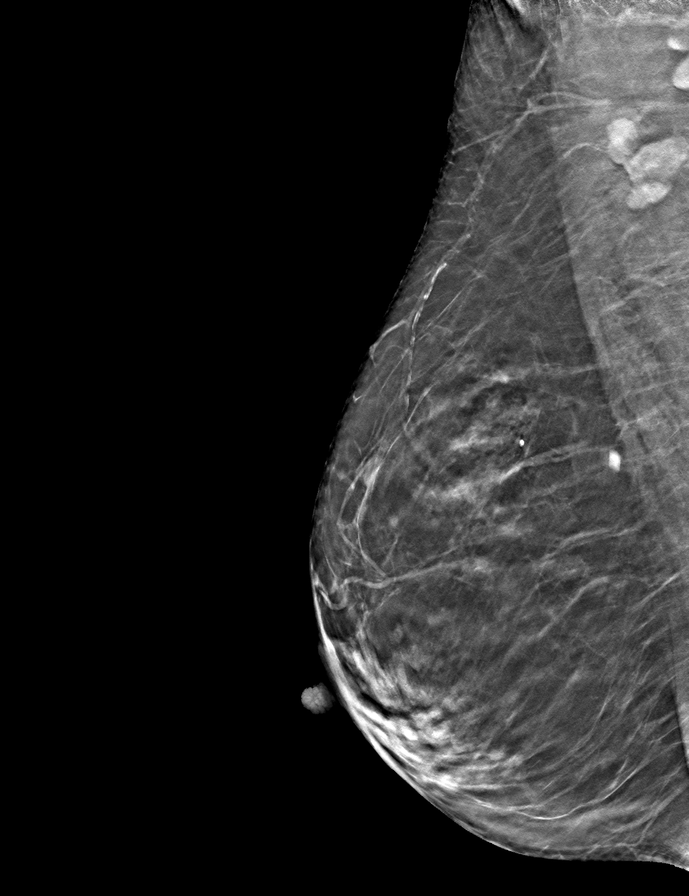
[frame 27/52]
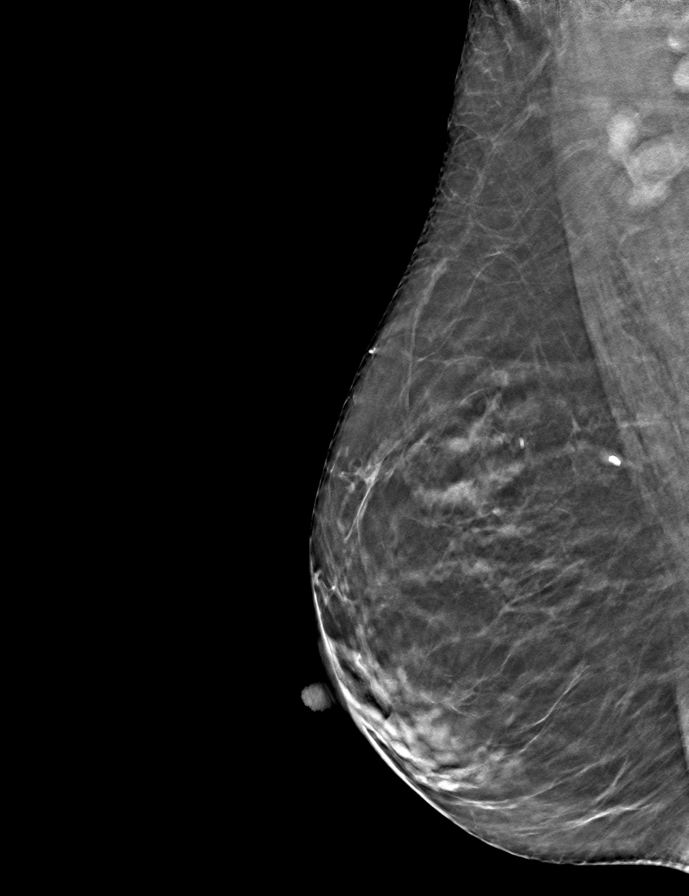

[L CC tomo · tomo slice 25/50.0]
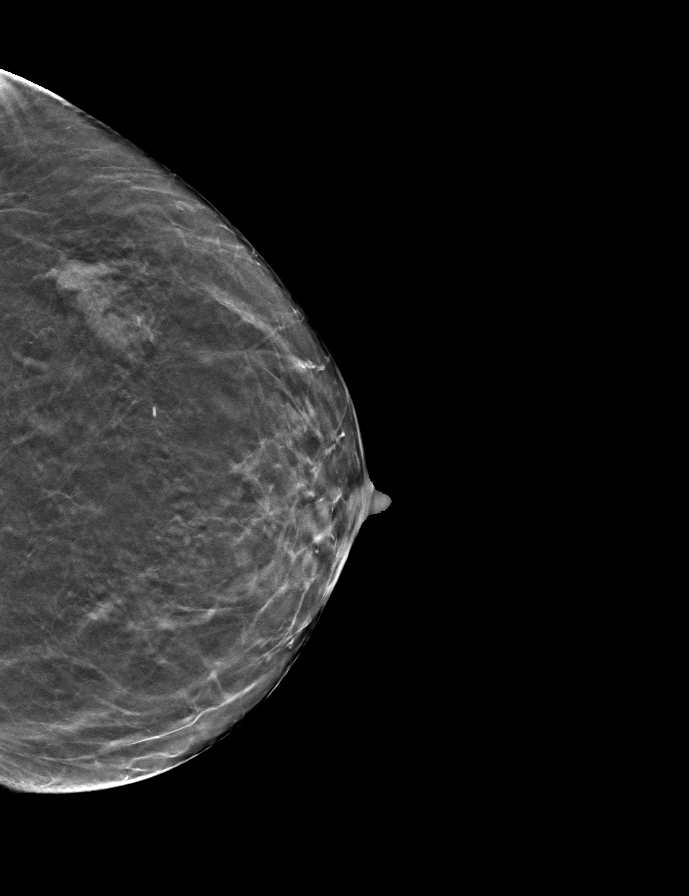

[L MLO tomo · tomo slice 27/52.0]
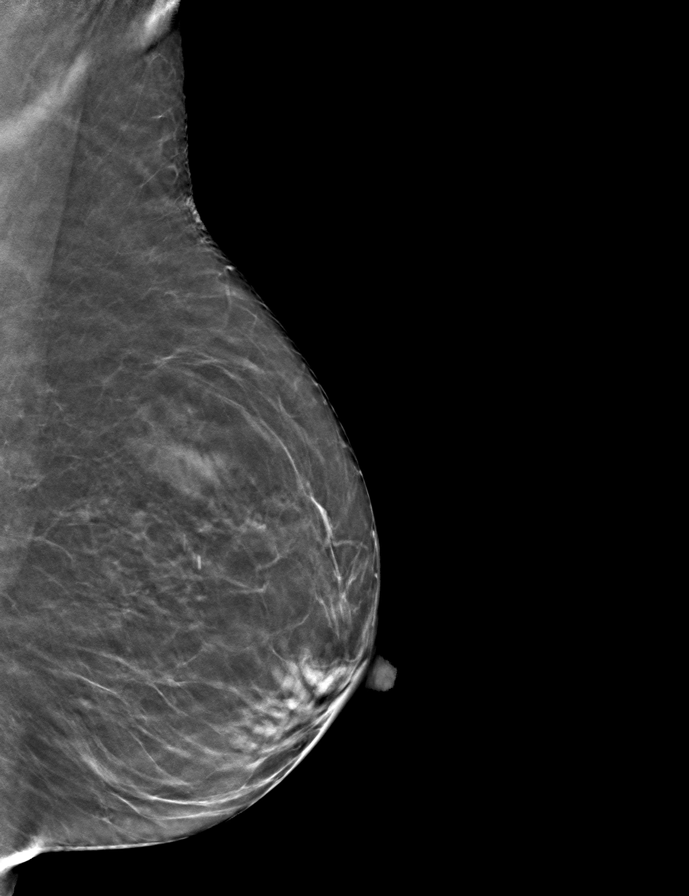

[R CC tomo · tomo slice 25/48.0]
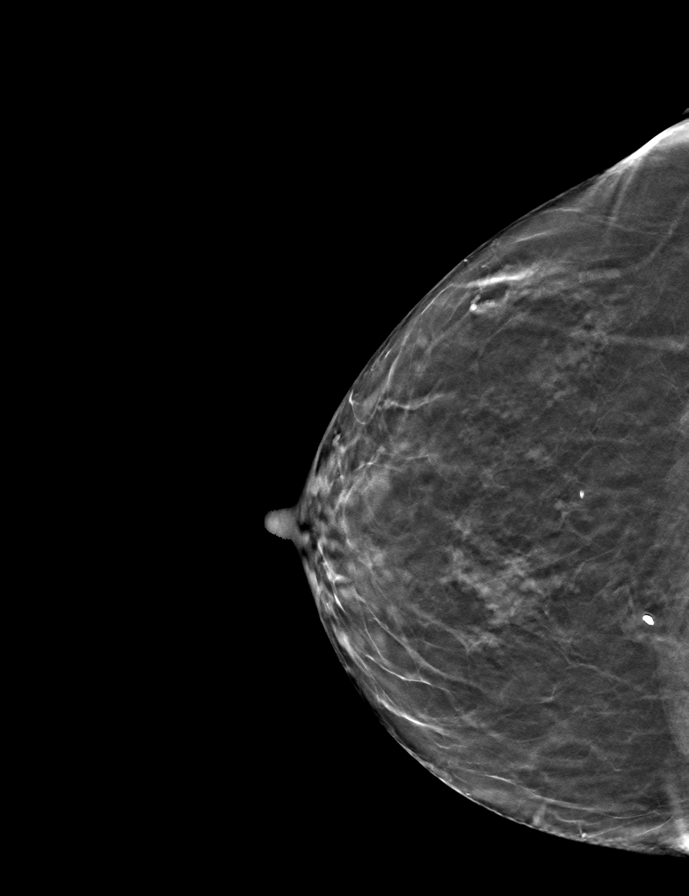

[9 of 24 positions shown; findings below may reference images not displayed]

ACR Breast Density Category b: There are scattered areas of
fibroglandular density.
FINDINGS: There are no findings suspicious for malignancy. The images were
evaluated with computer-aided detection.
IMPRESSION: No mammographic evidence of malignancy. A result letter of this
screening mammogram will be mailed directly to the patient.

RECOMMENDATION:
Screening mammogram in one year. (Code:WJ-I-BG6)

BI-RADS CATEGORY  1: Negative.

## 2023-04-27 DIAGNOSIS — I10 Essential (primary) hypertension: Secondary | ICD-10-CM | POA: Diagnosis not present

## 2023-04-27 DIAGNOSIS — F419 Anxiety disorder, unspecified: Secondary | ICD-10-CM | POA: Diagnosis not present

## 2023-04-27 DIAGNOSIS — J309 Allergic rhinitis, unspecified: Secondary | ICD-10-CM | POA: Diagnosis not present

## 2023-04-27 DIAGNOSIS — Z6820 Body mass index (BMI) 20.0-20.9, adult: Secondary | ICD-10-CM | POA: Diagnosis not present

## 2023-04-27 DIAGNOSIS — L719 Rosacea, unspecified: Secondary | ICD-10-CM | POA: Diagnosis not present

## 2023-09-10 ENCOUNTER — Other Ambulatory Visit: Payer: Self-pay

## 2023-09-10 ENCOUNTER — Emergency Department (HOSPITAL_COMMUNITY)
Admission: EM | Admit: 2023-09-10 | Discharge: 2023-09-11 | Disposition: A | Discharge status: Home / Self Care | Attending: Emergency Medicine | Admitting: Emergency Medicine

## 2023-09-10 ENCOUNTER — Encounter (HOSPITAL_COMMUNITY): Payer: Self-pay | Admitting: Emergency Medicine

## 2023-09-10 DIAGNOSIS — Z23 Encounter for immunization: Secondary | ICD-10-CM | POA: Diagnosis not present

## 2023-09-10 DIAGNOSIS — S81811A Laceration without foreign body, right lower leg, initial encounter: Secondary | ICD-10-CM | POA: Insufficient documentation

## 2023-09-10 DIAGNOSIS — W109XXA Fall (on) (from) unspecified stairs and steps, initial encounter: Secondary | ICD-10-CM | POA: Insufficient documentation

## 2023-09-10 DIAGNOSIS — S8991XA Unspecified injury of right lower leg, initial encounter: Secondary | ICD-10-CM | POA: Diagnosis present

## 2023-09-10 NOTE — ED Triage Notes (Addendum)
 Pt reports she has had a "couple beers" and went down the basement steps, falling down one wooden step and lacerating her right calf/ankle area.  Did not fall all the way down, just missed the step and cut her leg.  Pt called family who advised her come to the ED.  Tetanus last unknown

## 2023-09-11 MED ORDER — DOXYCYCLINE HYCLATE 100 MG PO CAPS: 100.0000 mg | ORAL_CAPSULE | Freq: Two times a day (BID) | ORAL | 0 refills | Status: DC

## 2023-09-11 MED ORDER — TETANUS-DIPHTH-ACELL PERTUSSIS 5-2.5-18.5 LF-MCG/0.5 IM SUSY
0.5000 mL | PREFILLED_SYRINGE | Freq: Once | INTRAMUSCULAR | Status: AC
  Administered 2023-09-11: 0.5 mL via INTRAMUSCULAR
  Filled 2023-09-11: qty 0.5

## 2023-09-11 MED ORDER — LIDOCAINE-EPINEPHRINE (PF) 2 %-1:200000 IJ SOLN
10.0000 mL | Freq: Once | INTRAMUSCULAR | Status: DC
  Filled 2023-09-11: qty 20

## 2023-09-11 NOTE — ED Provider Notes (Signed)
 [place] EMERGENCY DEPARTMENT AT [facility] Provider Note   CSN: [id number] Arrival date & time: 09/10/23  2206     History  Chief Complaint  Patient presents with   Fall   Laceration    Alison Allen is a 68 y.o. female.  Patient presents to the emergency department for evaluation of a laceration on the back of her right lower leg.  Reports that she was walking down her cellar steps and slipped, only slipped down 1 step but injured her leg.  She did not fall.  She think she hit the back of her leg on the step.       Home Medications Prior to Admission medications   Medication Sig Start Date End Date Taking? Authorizing Provider  doxycycline (VIBRAMYCIN) 100 MG capsule Take 1 capsule (100 mg total) by mouth 2 (two) times daily. 09/11/23  Yes Vlasta Baskin, Canary Brim, MD  alprazolam Prudy Feeler) 2 MG tablet Take 2 mg by mouth in the morning, at noon, in the evening, and at bedtime.    [provider]  CALCIUM-VITAMIN D PO Take 1 tablet by mouth daily.    [provider]  Chlorpheniramine-Phenylephrine 4-10 MG tablet Take 1 tablet by mouth daily. [facility]    [provider]  clindamycin (CLINDAGEL) 1 % gel Apply topically. 11/05/22   [provider]  enalapril (VASOTEC) 20 MG tablet Take 20 mg by mouth daily.    [provider]  hydrochlorothiazide (HYDRODIURIL) 25 MG tablet Take 25 mg by mouth daily.    [provider]  Multiple Vitamins-Minerals (MULTIVITAMIN WITH MINERALS) tablet Take 1 tablet by mouth daily.    [provider]      Allergies    Patient has no known allergies.    Review of Systems   Review of Systems  Physical Exam Updated Vital Signs BP (!) 134/101 (BP Location: Right Arm)   Pulse 88   Temp 98.4 F (36.9 C) (Oral)   Resp 18   SpO2 99%  Physical Exam Vitals reviewed.  Constitutional:      Appearance: Normal appearance.  HENT:     Head: Atraumatic.   Musculoskeletal:     Left lower leg: Laceration (posterior) present.       Legs:  Skin:    Findings: Laceration present.  Neurological:     Mental Status: She is alert.     Cranial Nerves: Cranial nerves 2-12 are intact.     Sensory: Sensation is intact.     Motor: Motor function is intact.     ED Results / Procedures / Treatments   Labs (all labs ordered are listed, but only abnormal results are displayed) Labs Reviewed - No data to display  EKG None  Radiology No results found.  Procedures .Laceration Repair  Date/Time: 09/11/2023 1:18 AM  Performed by: Gilda Crease, MD Authorized by: Gilda Crease, MD   Consent:    Consent obtained:  Verbal   Consent given by:  Patient   Risks, benefits, and alternatives were discussed: yes     Risks discussed:  Infection, pain, retained foreign body and poor cosmetic result Universal protocol:    Procedure explained and questions answered to patient or proxy's satisfaction: yes     Site/side marked: yes     Immediately prior to procedure, a time out was called: yes     Patient identity confirmed:  Verbally with patient Anesthesia:    Anesthesia method:  Local infiltration  Local anesthetic:  Lidocaine 2% WITH epi Laceration details:    Location:  Leg   Leg location:  R lower leg   Length (cm):  10 Pre-procedure details:    Preparation:  Patient was prepped and draped in usual sterile fashion Exploration:    Hemostasis achieved with:  Direct pressure   Wound extent: no signs of injury and no vascular damage     Contaminated: no   Treatment:    Area cleansed with:  Povidone-iodine   Amount of cleaning:  Extensive   Irrigation solution:  Sterile saline   Irrigation volume:  1000   Irrigation method:  Syringe   Debridement:  None   Undermining:  None Skin repair:    Repair method:  Sutures   Suture size:  3-0   Suture material:  Prolene   Suture technique:  Simple interrupted and horizontal  mattress (5 horizontal mattress staggered with 6 simple interrupted) Approximation:    Approximation:  Close Repair type:    Repair type:  Intermediate Post-procedure details:    Dressing:  Bulky dressing   Procedure completion:  Tolerated well, no immediate complications     Medications Ordered in ED Medications  lidocaine-EPINEPHrine (XYLOCAINE W/EPI) 2 %-1:200000 (PF) injection 10 mL (has no administration in time range)  Tdap (BOOSTRIX) injection 0.5 mL (has no administration in time range)    ED Course/ Medical Decision Making/ A&P                                 Medical Decision Making Risk Prescription drug management.   Presents to the emergency department for evaluation of laceration to the back of the right leg.  It appears that the patient caught the lower leg on the edge of a step and it caused a large laceration and skin tear.  There is some devitalized and macerated tissue.  Repair was performed as above, empiric antibiotics prescribed.  Suture removal in 10 to 12 days.        Final Clinical Impression(s) / ED Diagnoses Final diagnoses:  Leg laceration, right, initial encounter    Rx / DC Orders ED Discharge Orders          Ordered    doxycycline (VIBRAMYCIN) 100 MG capsule  2 times daily        09/11/23 0123              Gilda Crease, MD 09/11/23 [phone]

## 2023-09-11 NOTE — Discharge Instructions (Signed)
 See your doctor or return here in 10 to 12 days to have the stitches removed.

## 2023-09-18 DIAGNOSIS — Z6821 Body mass index (BMI) 21.0-21.9, adult: Secondary | ICD-10-CM | POA: Diagnosis not present

## 2023-09-18 DIAGNOSIS — S81811D Laceration without foreign body, right lower leg, subsequent encounter: Secondary | ICD-10-CM | POA: Diagnosis not present

## 2023-09-18 DIAGNOSIS — S8011XD Contusion of right lower leg, subsequent encounter: Secondary | ICD-10-CM | POA: Diagnosis not present

## 2023-09-24 DIAGNOSIS — S81811D Laceration without foreign body, right lower leg, subsequent encounter: Secondary | ICD-10-CM | POA: Diagnosis not present

## 2023-09-24 DIAGNOSIS — Z6822 Body mass index (BMI) 22.0-22.9, adult: Secondary | ICD-10-CM | POA: Diagnosis not present

## 2023-09-24 DIAGNOSIS — S8011XD Contusion of right lower leg, subsequent encounter: Secondary | ICD-10-CM | POA: Diagnosis not present

## 2023-09-24 DIAGNOSIS — S80812A Abrasion, left lower leg, initial encounter: Secondary | ICD-10-CM | POA: Diagnosis not present

## 2023-10-01 DIAGNOSIS — F419 Anxiety disorder, unspecified: Secondary | ICD-10-CM | POA: Diagnosis not present

## 2023-10-01 DIAGNOSIS — S81811D Laceration without foreign body, right lower leg, subsequent encounter: Secondary | ICD-10-CM | POA: Diagnosis not present

## 2023-10-01 DIAGNOSIS — R7989 Other specified abnormal findings of blood chemistry: Secondary | ICD-10-CM | POA: Diagnosis not present

## 2023-10-01 DIAGNOSIS — S80812A Abrasion, left lower leg, initial encounter: Secondary | ICD-10-CM | POA: Diagnosis not present

## 2023-10-01 DIAGNOSIS — J309 Allergic rhinitis, unspecified: Secondary | ICD-10-CM | POA: Diagnosis not present

## 2023-10-01 DIAGNOSIS — S8011XD Contusion of right lower leg, subsequent encounter: Secondary | ICD-10-CM | POA: Diagnosis not present

## 2023-10-01 DIAGNOSIS — E063 Autoimmune thyroiditis: Secondary | ICD-10-CM | POA: Diagnosis not present

## 2023-10-01 DIAGNOSIS — L719 Rosacea, unspecified: Secondary | ICD-10-CM | POA: Diagnosis not present

## 2023-10-01 DIAGNOSIS — I10 Essential (primary) hypertension: Secondary | ICD-10-CM | POA: Diagnosis not present

## 2023-12-15 ENCOUNTER — Other Ambulatory Visit (HOSPITAL_COMMUNITY): Payer: Self-pay | Admitting: Internal Medicine

## 2023-12-15 DIAGNOSIS — Z1231 Encounter for screening mammogram for malignant neoplasm of breast: Secondary | ICD-10-CM

## 2023-12-21 ENCOUNTER — Ambulatory Visit (HOSPITAL_COMMUNITY)
Admission: RE | Admit: 2023-12-21 | Discharge: 2023-12-21 | Disposition: A | Discharge status: Home / Self Care | Source: Ambulatory Visit | Attending: Internal Medicine | Admitting: Internal Medicine

## 2023-12-21 DIAGNOSIS — Z1231 Encounter for screening mammogram for malignant neoplasm of breast: Secondary | ICD-10-CM | POA: Insufficient documentation

## 2024-01-21 ENCOUNTER — Encounter: Payer: Self-pay | Admitting: Nurse Practitioner

## 2024-01-21 ENCOUNTER — Ambulatory Visit: Payer: Self-pay | Admitting: Nurse Practitioner

## 2024-01-21 VITALS — BP 142/80 | HR 107 | Resp 16 | Ht 66.0 in | Wt 143.6 lb

## 2024-01-21 DIAGNOSIS — F419 Anxiety disorder, unspecified: Secondary | ICD-10-CM | POA: Diagnosis not present

## 2024-01-21 DIAGNOSIS — I10 Essential (primary) hypertension: Secondary | ICD-10-CM

## 2024-01-21 DIAGNOSIS — J309 Allergic rhinitis, unspecified: Secondary | ICD-10-CM

## 2024-01-21 NOTE — Progress Notes (Signed)
 New Patient Office Visit  Subjective    Patient ID: FATINA SPRANKLE, female    DOB: 12-07-1955  Age: 68 y.o. MRN: [id number]  CC:  Chief Complaint  Patient presents with   Establish Care    Asking for refill of xanax     HPI BETZAIRA MENTEL presents to establish care Patient here today as a new patient, her previous provider is retiring.  Patient has last mammo screen In June 2025 and screening labs with cholesterol was done in June 2025.    Outpatient Encounter Medications as of 01/21/2024  Medication Sig   alprazolam (XANAX) 2 MG tablet Take 2 mg by mouth in the morning, at noon, in the evening, and at bedtime.   CALCIUM-VITAMIN D PO Take 1 tablet by mouth daily.   clindamycin (CLINDAGEL) 1 % gel Apply topically.   enalapril (VASOTEC) 20 MG tablet Take 20 mg by mouth daily.   hydrochlorothiazide (HYDRODIURIL) 25 MG tablet Take 25 mg by mouth daily.   Multiple Vitamins-Minerals (MULTIVITAMIN WITH MINERALS) tablet Take 1 tablet by mouth daily.   doxycycline  (VIBRAMYCIN ) 100 MG capsule Take 1 capsule (100 mg total) by mouth 2 (two) times daily.   [DISCONTINUED] Chlorpheniramine-Phenylephrine 4-10 MG tablet Take 1 tablet by mouth daily. [facility]   No facility-administered encounter medications on file as of 01/21/2024.    Past Medical History:  Diagnosis Date   Anxiety    Hypertension     Past Surgical History:  Procedure Laterality Date   COLONOSCOPY WITH PROPOFOL  N/A 06/18/2020   Procedure: COLONOSCOPY WITH PROPOFOL ;  Surgeon: Cindie Carlin POUR, DO;  [facility]: [facility];  Service: Endoscopy;  Laterality: N/A;  3:00pm   NOVASURE ABLATION     POLYPECTOMY  06/18/2020   Procedure: POLYPECTOMY;  Surgeon: Cindie Carlin POUR, DO;  [facility];  Service: Endoscopy;;   TUBAL LIGATION      Family History  Problem Relation Age of Onset   Dementia Mother        deceased at age 75   Stroke Father 59       deceased due to stroke   Colon polyps  Sister        in mid 52s with 66 adenomas   Liver cancer Sister    Seizures Son    Hypertension Other     Social History   Socioeconomic History   Marital status: Widowed    Spouse name: Not on file   Number of children: Not on file   Years of education: Not on file   Highest education level: Not on file  Occupational History   Not on file  Tobacco Use   Smoking status: Former    Types: Cigarettes   Smokeless tobacco: Never  Vaping Use   Vaping status: Never Used  Substance and Sexual Activity   Alcohol use: Not Currently    Comment: occas   Drug use: Never   Sexual activity: Yes  Other Topics Concern   Not on file  Social History Narrative   Not on file   Social Drivers of Health   Financial Resource Strain: Not on file  Food Insecurity: Not on file  Transportation Needs: Not on file  Physical Activity: Not on file  Stress: Not on file  Social Connections: Not on file  Intimate Partner Violence: Not on file    ROS      Objective    BP (!) 153/94   Pulse (!) 107  Resp 16   Ht 5' 6 (1.676 m)   Wt 143 lb 9.6 oz (65.1 kg)   SpO2 95%   BMI 23.18 kg/m   Physical Exam Vitals and nursing note reviewed.  Constitutional:      Appearance: Normal appearance.  HENT:     Head: Normocephalic.     Nose: Nose normal.     Mouth/Throat:     Mouth: Mucous membranes are moist.  Eyes:     Extraocular Movements: Extraocular movements intact.     Pupils: Pupils are equal, round, and reactive to light.  Cardiovascular:     Rate and Rhythm: Normal rate and regular rhythm.     Pulses: Normal pulses.     Heart sounds: Normal heart sounds.  Pulmonary:     Effort: Pulmonary effort is normal.     Breath sounds: Normal breath sounds.  Abdominal:     General: Bowel sounds are normal.     Palpations: Abdomen is soft.  Musculoskeletal:        General: Normal range of motion.     Cervical back: Normal range of motion and neck supple.  Skin:    General: Skin is  warm and dry.  Neurological:     Mental Status: She is alert and oriented to person, place, and time.  Psychiatric:        Mood and Affect: Mood normal.        Behavior: Behavior normal.         Assessment & Plan:   Problem List Items Addressed This Visit   None   No follow-ups on file.   Neale Carpen, NP

## 2024-01-21 NOTE — Patient Instructions (Signed)
 1) Recheck of BP was 142/80 2) Pt advised to recheck BP with home unit 3) Patient will have a physical next month with her previous PCP but will return in 4 months and come fasting

## 2024-02-15 DIAGNOSIS — Z0001 Encounter for general adult medical examination with abnormal findings: Secondary | ICD-10-CM | POA: Diagnosis not present

## 2024-02-15 DIAGNOSIS — M1990 Unspecified osteoarthritis, unspecified site: Secondary | ICD-10-CM | POA: Diagnosis not present

## 2024-02-15 DIAGNOSIS — Z1331 Encounter for screening for depression: Secondary | ICD-10-CM | POA: Diagnosis not present

## 2024-02-15 DIAGNOSIS — Z6821 Body mass index (BMI) 21.0-21.9, adult: Secondary | ICD-10-CM | POA: Diagnosis not present

## 2024-02-15 DIAGNOSIS — F419 Anxiety disorder, unspecified: Secondary | ICD-10-CM | POA: Diagnosis not present

## 2024-02-15 DIAGNOSIS — I10 Essential (primary) hypertension: Secondary | ICD-10-CM | POA: Diagnosis not present

## 2024-03-01 NOTE — Progress Notes (Unsigned)
 GI Office Note    Referring Provider: Glennon Sand, NP Primary Care Physician:  Glennon Sand, NP  Primary Gastroenterologist: Carlin POUR. Cindie, DO   Chief Complaint   No chief complaint on file.   History of Present Illness   AMEIRAH KHATOON is a 68 y.o. female presenting today for elevated lfts. She was seen in 10/2022 for evaluation of liver lesion, mild elevation of LFTs.    Prior Data  Labs [facility]***  Labs 10/2022:***  Abdominal ultrasound October 03, 2022: Echogenic lesion measuring 2.1 cm typical for cavernous hemangioma right lobe of the liver.      Colonoscopy December 2021: -Nonbleeding internal hemorrhoids -Diverticulosis -one 8 mm polyp in the sigmoid colon, no adenomatous features identified. -due colonoscopy 05/2025 due to sister with adenomatous colon polyps at age <17     Medications   Current Outpatient Medications  Medication Sig Dispense Refill   alprazolam (XANAX) 2 MG tablet Take 2 mg by mouth in the morning, at noon, in the evening, and at bedtime.     CALCIUM-VITAMIN D PO Take 1 tablet by mouth daily.     clindamycin (CLINDAGEL) 1 % gel Apply topically.     doxycycline  (VIBRAMYCIN ) 100 MG capsule Take 1 capsule (100 mg total) by mouth 2 (two) times daily. 20 capsule 0   enalapril (VASOTEC) 20 MG tablet Take 20 mg by mouth daily.     hydrochlorothiazide (HYDRODIURIL) 25 MG tablet Take 25 mg by mouth daily.     Multiple Vitamins-Minerals (MULTIVITAMIN WITH MINERALS) tablet Take 1 tablet by mouth daily.     No current facility-administered medications for this visit.    Allergies   Allergies as of 03/02/2024   (No Known Allergies)     Past Medical History   Past Medical History:  Diagnosis Date   Anxiety    Hypertension     Past Surgical History   Past Surgical History:  Procedure Laterality Date   COLONOSCOPY WITH PROPOFOL  N/A 06/18/2020   Procedure: COLONOSCOPY WITH PROPOFOL ;  Surgeon: Cindie Carlin POUR, DO;  [facility];  Service: Endoscopy;  Laterality: N/A;  3:00pm   NOVASURE ABLATION     POLYPECTOMY  06/18/2020   Procedure: POLYPECTOMY;  Surgeon: Cindie Carlin POUR, DO;  [facility]: [facility];  Service: Endoscopy;;   TUBAL LIGATION      Past Family History   Family History  Problem Relation Age of Onset   Dementia Mother        deceased at age 68   Stroke Father 49       deceased due to stroke   Colon polyps Sister        in mid 15s with 95 adenomas   Liver cancer Sister    Seizures Son    Hypertension Other     Past Social History   Social History   Socioeconomic History   Marital status: Widowed    Spouse name: Not on file   Number of children: Not on file   Years of education: Not on file   Highest education level: Not on file  Occupational History   Not on file  Tobacco Use   Smoking status: Former    Types: Cigarettes   Smokeless tobacco: Never  Vaping Use   Vaping status: Never Used  Substance and Sexual Activity   Alcohol use: Not Currently    Comment: occas   Drug use: Never   Sexual activity: Yes  Other Topics Concern  Not on file  Social History Narrative   Not on file   Social Drivers of Health   Financial Resource Strain: Not on file  Food Insecurity: Not on file  Transportation Needs: Not on file  Physical Activity: Not on file  Stress: Not on file  Social Connections: Not on file  Intimate Partner Violence: Not on file    Review of Systems   General: Negative for anorexia, weight loss, fever, chills, fatigue, weakness. ENT: Negative for hoarseness, difficulty swallowing , nasal congestion. CV: Negative for chest pain, angina, palpitations, dyspnea on exertion, peripheral edema.  Respiratory: Negative for dyspnea at rest, dyspnea on exertion, cough, sputum, wheezing.  GI: See history of present illness. GU:  Negative for dysuria, hematuria, urinary incontinence, urinary frequency, nocturnal urination.  Endo: Negative for unusual  weight change.     Physical Exam   There were no vitals taken for this visit.   General: Well-nourished, well-developed in no acute distress.  Eyes: No icterus. Mouth: Oropharyngeal mucosa moist and pink   Lungs: Clear to auscultation bilaterally.  Heart: Regular rate and rhythm, no murmurs rubs or gallops.  Abdomen: Bowel sounds are normal, nontender, nondistended, no hepatosplenomegaly or masses,  no abdominal bruits or hernia , no rebound or guarding.  Rectal: not performed Extremities: No lower extremity edema. No clubbing or deformities. Neuro: Alert and oriented x 4   Skin: Warm and dry, no jaundice.   Psych: Alert and cooperative, normal mood and affect.  Labs   *** Imaging Studies   No results found.  Assessment/Plan:           Sonny RAMAN. Ezzard, MHS, PA-C [facility]

## 2024-03-02 ENCOUNTER — Encounter: Payer: Self-pay | Admitting: Gastroenterology

## 2024-03-02 ENCOUNTER — Ambulatory Visit: Admitting: Gastroenterology

## 2024-03-02 VITALS — BP 144/88 | HR 112 | Temp 98.5°F | Ht 66.0 in | Wt 143.0 lb

## 2024-03-02 DIAGNOSIS — E871 Hypo-osmolality and hyponatremia: Secondary | ICD-10-CM

## 2024-03-02 DIAGNOSIS — R7989 Other specified abnormal findings of blood chemistry: Secondary | ICD-10-CM

## 2024-03-02 DIAGNOSIS — F1090 Alcohol use, unspecified, uncomplicated: Secondary | ICD-10-CM

## 2024-03-02 DIAGNOSIS — E876 Hypokalemia: Secondary | ICD-10-CM | POA: Diagnosis not present

## 2024-03-02 DIAGNOSIS — F1010 Alcohol abuse, uncomplicated: Secondary | ICD-10-CM | POA: Insufficient documentation

## 2024-03-02 NOTE — Patient Instructions (Addendum)
 I suspect your elevated liver labs and low sodium is due to regular alcohol use. Orders provided today for additional labs.   Please try to limit alcohol use to no more than 2 drinks (12 ounce beer, 5 ounce wine, 1 ounce liquor) per day per guidelines for female. Try not to drink every day. The goal would be to eliminate alcohol use completely due to abnormal labs and your body's reaction to alcohol.

## 2024-04-05 ENCOUNTER — Telehealth: Payer: Self-pay

## 2024-04-05 ENCOUNTER — Telehealth: Payer: Self-pay | Admitting: Nurse Practitioner

## 2024-04-05 ENCOUNTER — Other Ambulatory Visit: Payer: Self-pay

## 2024-04-05 LAB — CBC WITH DIFFERENTIAL/PLATELET
Basophils Absolute: 0.1 x10E3/uL (ref 0.0–0.2)
Basos: 1 %
EOS (ABSOLUTE): 0 x10E3/uL (ref 0.0–0.4)
Eos: 0 %
Hematocrit: 43.6 % (ref 34.0–46.6)
Hemoglobin: 14.7 g/dL (ref 11.1–15.9)
Immature Grans (Abs): 0 x10E3/uL (ref 0.0–0.1)
Immature Granulocytes: 0 %
Lymphocytes Absolute: 1.2 x10E3/uL (ref 0.7–3.1)
Lymphs: 13 %
MCH: 33.6 pg — ABNORMAL HIGH (ref 26.6–33.0)
MCHC: 33.7 g/dL (ref 31.5–35.7)
MCV: 100 fL — ABNORMAL HIGH (ref 79–97)
Monocytes Absolute: 1 x10E3/uL — ABNORMAL HIGH (ref 0.1–0.9)
Monocytes: 11 %
Neutrophils Absolute: 7 x10E3/uL (ref 1.4–7.0)
Neutrophils: 75 %
Platelets: 255 x10E3/uL (ref 150–450)
RBC: 4.38 x10E6/uL (ref 3.77–5.28)
RDW: 13.8 % (ref 11.7–15.4)
WBC: 9.3 x10E3/uL (ref 3.4–10.8)

## 2024-04-05 LAB — COMPREHENSIVE METABOLIC PANEL WITH GFR
ALT: 66 IU/L — ABNORMAL HIGH (ref 0–32)
AST: 94 IU/L — ABNORMAL HIGH (ref 0–40)
Albumin: 4.1 g/dL (ref 3.9–4.9)
Alkaline Phosphatase: 146 IU/L — ABNORMAL HIGH (ref 49–135)
BUN/Creatinine Ratio: 11 — ABNORMAL LOW (ref 12–28)
BUN: 7 mg/dL — ABNORMAL LOW (ref 8–27)
Bilirubin Total: 0.6 mg/dL (ref 0.0–1.2)
CO2: 25 mmol/L (ref 20–29)
Calcium: 9.6 mg/dL (ref 8.7–10.3)
Chloride: 89 mmol/L — ABNORMAL LOW (ref 96–106)
Creatinine, Ser: 0.61 mg/dL (ref 0.57–1.00)
Globulin, Total: 2.5 g/dL (ref 1.5–4.5)
Glucose: 105 mg/dL — ABNORMAL HIGH (ref 70–99)
Potassium: 3.3 mmol/L — ABNORMAL LOW (ref 3.5–5.2)
Sodium: 134 mmol/L (ref 134–144)
Total Protein: 6.6 g/dL (ref 6.0–8.5)
eGFR: 99 mL/min/1.73 (ref >59)

## 2024-04-05 LAB — IRON AND TIBC
Iron Saturation: 16 % (ref 15–55)
Iron: 35 ug/dL (ref 27–139)
Total Iron Binding Capacity: 214 ug/dL — ABNORMAL LOW (ref 250–450)
UIBC: 179 ug/dL (ref 118–369)

## 2024-04-05 LAB — PROTIME-INR
INR: 1 (ref 0.9–1.2)
Prothrombin Time: 10.6 s (ref 9.1–12.0)

## 2024-04-05 LAB — FERRITIN: Ferritin: 1707 ng/mL — ABNORMAL HIGH (ref 15–150)

## 2024-04-05 NOTE — Telephone Encounter (Unsigned)
 Copied from CRM [id number]. Topic: Clinical - Medication Refill >> Apr 05, 2024 11:15 AM Emylou G wrote: Medication: alprazolam (XANAX) 2 MG tablet  Has the patient contacted their pharmacy? Yes (Agent: If no, request that the patient contact the pharmacy for the refill. If patient does not wish to contact the pharmacy document the reason why and proceed with request.) (Agent: If yes, when and what did the pharmacy advise?) said to call us ?  This is the patient's preferred pharmacy:  [facility] [phone] - [place], [place] - [address] AT [facility] [address] [place] [address] [place] [address] Phone: [phone] Fax: [phone]  Is this the correct pharmacy for this prescription? Yes If no, delete pharmacy and type the correct one.   Has the prescription been filled recently? No  Is the patient out of the medication? Yes  Has the patient been seen for an appointment in the last year OR does the patient have an upcoming appointment? Yes  Can we respond through MyChart? No  Agent: Please be advised that Rx refills may take up to 3 business days. We ask that you follow-up with your pharmacy.

## 2024-04-05 NOTE — Telephone Encounter (Signed)
.  [id number]    Copied from CRM #[id number]. Topic: Clinical - Medication Refill >> Apr 05, 2024 11:15 AM Emylou G wrote: Medication: alprazolam (XANAX) 2 MG tablet  Has the patient contacted their pharmacy? Yes (Agent: If no, request that the patient contact the pharmacy for the refill. If patient does not wish to contact the pharmacy document the reason why and proceed with request.) (Agent: If yes, when and what did the pharmacy advise?) said to call us ?  This is the patient's preferred pharmacy:  [facility] [phone] - [place], [place] - [address] AT [facility] OF [address] [place] [address] Phone: [phone] Fax: [phone]  Is this the correct pharmacy for this prescription? Yes If no, delete pharmacy and type the correct one.   Has the prescription been filled recently? No  Is the patient out of the medication? Yes  Has the patient been seen for an appointment in the last year OR does the patient have an upcoming appointment? Yes  Can we respond through MyChart? No  Agent: Please be advised that Rx refills may take up to 3 business days. We ask that you follow-up with your pharmacy.

## 2024-04-05 NOTE — Telephone Encounter (Signed)
 Copied from CRM #[id number]. Topic: Clinical - Prescription Issue >> Apr 05, 2024  3:23 PM Alison Allen wrote: Reason for CRM: pt was pt of [facility]. Pt would like to know if her xanax is going to be refilled.dueto provider being changed.   And also would like a call about labs results when ready. Of labs performed 04/05/2024    Pls follow up via phone call with pt

## 2024-04-07 NOTE — Telephone Encounter (Signed)
 Will need an appt with another provider. Xanax has not been filled by our office before. Last refill was 9/13 from [facility]

## 2024-04-07 NOTE — Telephone Encounter (Signed)
Pt has been scheduled for 10/23.

## 2024-04-11 ENCOUNTER — Ambulatory Visit: Payer: Self-pay

## 2024-04-11 NOTE — Telephone Encounter (Signed)
 FYI Only or Action Required?: FYI only for provider.  Patient was last seen in primary care on 01/21/2024 by Glennon Sand, NP.  Called Nurse Triage reporting Dizziness and Fatigue.  Symptoms began several weeks ago.  Interventions attempted: OTC medications: Tylenol and Rest, hydration, or home remedies.  Symptoms are: lightheaded when bending over, fatigue/weakness with mild exertion, fell from lightheaded and hit head with swelling, headaches gradually improving.  Triage Disposition: See Physician Within 24 Hours  Patient/caregiver understands and will follow disposition?: Yes          Copied from CRM [id number]. Topic: Clinical - Red Word Triage >> Apr 11, 2024  8:42 AM Treva T wrote: Red Word that prompted transfer to Nurse Triage: Patient is calling, states she is having increased fatigue, dizziness, and weakness.   Patient is concerned that about iron levels, and blood work. Reason for Disposition  [1] MODERATE dizziness (e.g., interferes with normal activities) AND [2] has NOT been evaluated by doctor (or NP/PA) for this  (Exception: Dizziness caused by heat exposure, sudden standing, or poor fluid intake.)  Answer Assessment - Initial Assessment Questions Patient upset and anxious. She states she was seen by a Dr Bertell but has had change of providers. She states she is out of her medications. Patient is upset that she has not received a call back regarding her lab work. Advised patient the blood work was not done through PCP and she states GI ordered it, advised to follow up with GI but she states her provider is no longer at the practice. Patient scheduled with Dr Tobie at [facility] tomorrow.   1. DESCRIPTION: Describe your dizziness.     She states she has had fatigue/weakness and dizziness. She states if she bent over she would feel dizziness.  2. LIGHTHEADED: Do you feel lightheaded? (e.g., somewhat faint, woozy, weak upon standing)     Yes.   3. VERTIGO: Do you  feel like either you or the room is spinning or tilting? (i.e., vertigo)    She states she does not know and my ears have been itching  4. SEVERITY: How bad is it?  Do you feel like you are going to faint? Can you stand and walk?     She states during the time she had symptoms she was not able to walk to the mailbox without being exhausted. She would feel dizzy if she bent down to pick up her dog's bowl.  5. ONSET:  When did the dizziness begin?     About 2-3 weeks ago. She states she is better now. She states since about yesterday she has been better.  6. AGGRAVATING FACTORS: Does anything make it worse? (e.g., standing, change in head position)     Bending over.  7. HEART RATE: Can you tell me your heart rate? How many beats in 15 seconds?  (Note: Not all patients can do this.)       N/A.  8. CAUSE: What do you think is causing the dizziness? (e.g., decreased fluids or food, diarrhea, emotional distress, heat exposure, new medicine, sudden standing, vomiting; unknown)     She states she has very elevated iron levels. She is unsure what is causing the issues but she states she is very worried.  9. RECURRENT SYMPTOM: Have you had dizziness before? If Yes, ask: When was the last time? What happened that time?     No.  10. OTHER SYMPTOMS: Do you have any other symptoms? (e.g., fever, chest pain, vomiting, diarrhea, bleeding)  Headaches, fell  after taking about 4 steps out of her car, hit her head due to feeling lightheaded last week (she states her head was bleeding and has a goose egg now). Denies chest pain, SOB, diarrhea, abdominal pain.  11. PREGNANCY: Is there any chance you are pregnant? When was your last menstrual period?       N/A.  Protocols used: Dizziness - Lightheadedness-A-AH

## 2024-04-12 ENCOUNTER — Encounter: Payer: Self-pay | Admitting: Internal Medicine

## 2024-04-12 ENCOUNTER — Ambulatory Visit: Admitting: Internal Medicine

## 2024-04-12 VITALS — BP 106/70 | HR 96 | Ht 66.0 in | Wt 141.0 lb

## 2024-04-12 DIAGNOSIS — R42 Dizziness and giddiness: Secondary | ICD-10-CM | POA: Diagnosis not present

## 2024-04-12 DIAGNOSIS — F411 Generalized anxiety disorder: Secondary | ICD-10-CM

## 2024-04-12 DIAGNOSIS — I10 Essential (primary) hypertension: Secondary | ICD-10-CM

## 2024-04-12 DIAGNOSIS — E876 Hypokalemia: Secondary | ICD-10-CM

## 2024-04-12 MED ORDER — ENALAPRIL MALEATE 20 MG PO TABS
20.0000 mg | ORAL_TABLET | Freq: Every day | ORAL | 1 refills | Status: DC
Start: 2024-04-12 — End: 2024-04-21

## 2024-04-12 MED ORDER — HYDROCHLOROTHIAZIDE 25 MG PO TABS: 25.0000 mg | ORAL_TABLET | Freq: Every day | ORAL | 1 refills | Status: DC

## 2024-04-12 MED ORDER — POTASSIUM CHLORIDE CRYS ER 20 MEQ PO TBCR: 20.0000 meq | EXTENDED_RELEASE_TABLET | Freq: Every day | ORAL | 3 refills | Status: DC

## 2024-04-12 MED ORDER — MECLIZINE HCL 25 MG PO TABS: 25.0000 mg | ORAL_TABLET | Freq: Two times a day (BID) | ORAL | 0 refills | Status: AC | PRN

## 2024-04-12 MED ORDER — ALPRAZOLAM 2 MG PO TABS: 2.0000 mg | ORAL_TABLET | Freq: Four times a day (QID) | ORAL | 0 refills | Status: DC

## 2024-04-12 NOTE — Assessment & Plan Note (Addendum)
 Her episodes of dizziness and mild nausea likely due to vertigo Meclizine as needed for dizziness Avoid sudden positional changes Maintain at least 64 ounces of fluid intake and eat at regular intervals Strictly advised to avoid extra dosing of antihypertensives without medical guidance

## 2024-04-12 NOTE — Progress Notes (Signed)
 Acute Office Visit  Subjective:    Patient ID: Alison Allen, female    DOB: 28-Nov-1955, 68 y.o.   MRN: [id number]  Chief Complaint  Patient presents with   Dizziness    Dizzy, light headed, fatigued     HPI Patient is in today for complaint of episodes of dizziness in the last week.  She was feeling dizziness with position changes, especially with head movements.  She also reports mild nausea, but her symptoms have improved in this week.  Denies any recent episode of URTI.  HTN: Her BP is low normal today.  She admits that she tried taking 2 tablets of enalapril today as she was thinking her blood pressure was elevated.  She also takes HCTZ 25 mg QD.  Her orthostatic vitals were within normal limits today.  GAD: She takes Xanax 2 mg 4 times daily currently.  She has been taking Xanax for many years.  She reports being more anxious lately due to fear of running out of her medicines.  She requests refills of all of her medications.  Past Medical History:  Diagnosis Date   Anxiety    Hypertension     Past Surgical History:  Procedure Laterality Date   COLONOSCOPY WITH PROPOFOL  N/A 06/18/2020   Procedure: COLONOSCOPY WITH PROPOFOL ;  Surgeon: Cindie Carlin POUR, DO;  [facility];  Service: Endoscopy;  Laterality: N/A;  3:00pm   NOVASURE ABLATION     POLYPECTOMY  06/18/2020   Procedure: POLYPECTOMY;  Surgeon: Cindie Carlin POUR, DO;  Location: [facility];  Service: Endoscopy;;   TUBAL LIGATION      Family History  Problem Relation Age of Onset   Dementia Mother        deceased at age 73   Stroke Father 14       deceased due to stroke   Colon polyps Sister        in mid 71s with 22 adenomas   Liver cancer Sister    Seizures Son    Hypertension Other     Social History   Socioeconomic History   Marital status: Widowed    Spouse name: Not on file   Number of children: Not on file   Years of education: Not on file   Highest education level: 12th grade   Occupational History   Not on file  Tobacco Use   Smoking status: Former    Types: Cigarettes   Smokeless tobacco: Never  Vaping Use   Vaping status: Never Used  Substance and Sexual Activity   Alcohol use: Yes    Comment: she drinks around 4 days per week, mostly 2-3 beers per day but sometimes up to six. has been drinking regularly for 2-3 years   Drug use: Never   Sexual activity: Yes  Other Topics Concern   Not on file  Social History Narrative   Not on file   Social Drivers of Health   Financial Resource Strain: Low Risk  (04/11/2024)   Overall Financial Resource Strain (CARDIA)    Difficulty of Paying Living Expenses: Not hard at all  Food Insecurity: No Food Insecurity (04/11/2024)   Hunger Vital Sign    Worried About Running Out of Food in the Last Year: Never true    Ran Out of Food in the Last Year: Never true  Transportation Needs: No Transportation Needs (04/11/2024)   PRAPARE - Administrator, Civil Service (Medical): No    Lack of Transportation (  Non-Medical): No  Physical Activity: Unknown (04/11/2024)   Exercise Vital Sign    Days of Exercise per Week: Patient declined    Minutes of Exercise per Session: Not on file  Stress: Stress Concern Present (04/11/2024)   [employer] of Occupational Health - Occupational Stress Questionnaire    Feeling of Stress: To some extent  Social Connections: Unknown (04/11/2024)   Social Connection and Isolation Panel    Frequency of Communication with Friends and Family: More than three times a week    Frequency of Social Gatherings with Friends and Family: More than three times a week    Attends Religious Services: Patient declined    Database administrator or Organizations: Patient declined    Attends Banker Meetings: Not on file    Marital Status: Widowed  Intimate Partner Violence: Not on file    Outpatient Medications Prior to Visit  Medication Sig Dispense Refill   CALCIUM-VITAMIN  D PO Take 1 tablet by mouth daily.     Multiple Vitamins-Minerals (MULTIVITAMIN WITH MINERALS) tablet Take 1 tablet by mouth daily.     alprazolam (XANAX) 2 MG tablet Take 2 mg by mouth in the morning, at noon, in the evening, and at bedtime.     enalapril (VASOTEC) 20 MG tablet Take 20 mg by mouth daily.     hydrochlorothiazide (HYDRODIURIL) 25 MG tablet Take 25 mg by mouth daily.     No facility-administered medications prior to visit.    No Known Allergies  Review of Systems  Constitutional:  Negative for chills and fever.  HENT:  Negative for congestion and sore throat.   Eyes:  Negative for pain and discharge.  Respiratory:  Negative for cough and shortness of breath.   Cardiovascular:  Negative for chest pain and palpitations.  Gastrointestinal:  Negative for abdominal pain, constipation, diarrhea, nausea and vomiting.  Endocrine: Negative for polydipsia and polyuria.  Genitourinary:  Negative for dysuria and hematuria.  Musculoskeletal:  Negative for neck pain and neck stiffness.  Skin:  Negative for rash.  Neurological:  Positive for dizziness. Negative for weakness.  Psychiatric/Behavioral:  Negative for agitation and behavioral problems. The patient is nervous/anxious.        Objective:    Physical Exam Vitals reviewed.  Constitutional:      General: She is not in acute distress.    Appearance: She is not diaphoretic.  HENT:     Head: Normocephalic and atraumatic.     Nose: Nose normal.     Mouth/Throat:     Mouth: Mucous membranes are moist.  Eyes:     General: No scleral icterus.    Extraocular Movements: Extraocular movements intact.  Cardiovascular:     Rate and Rhythm: Normal rate and regular rhythm.     Heart sounds: Normal heart sounds. No murmur heard. Pulmonary:     Breath sounds: Normal breath sounds. No wheezing or rales.  Musculoskeletal:     Cervical back: Neck supple. No tenderness.     Right lower leg: No edema.     Left lower leg: No edema.   Skin:    General: Skin is warm.     Findings: No rash.  Neurological:     General: No focal deficit present.     Mental Status: She is alert and oriented to person, place, and time.  Psychiatric:        Mood and Affect: Mood is anxious.        Behavior: Behavior normal.  BP 106/70   Pulse 96   Ht 5' 6 (1.676 m)   Wt 141 lb (64 kg)   SpO2 99%   BMI 22.76 kg/m  Wt Readings from Last 3 Encounters:  04/12/24 141 lb (64 kg)  03/02/24 143 lb (64.9 kg)  01/21/24 143 lb 9.6 oz (65.1 kg)        Assessment & Plan:   Problem List Items Addressed This Visit       Cardiovascular and Mediastinum   Primary hypertension   BP Readings from Last 1 Encounters:  04/12/24 106/70   Low normal with enalapril 40 mg dose and HCTZ 25 mg QD Refilled enalapril 20 mg QD and HCTZ 25 mg QD Strictly advised to avoid extra dosing without medical guidance Counseled for compliance with the medications Advised DASH diet and moderate exercise/walking, at least 150 mins/week      Relevant Medications   hydrochlorothiazide (HYDRODIURIL) 25 MG tablet   enalapril (VASOTEC) 20 MG tablet     Other   Hypokalemia - Primary   Last CMP showed hypokalemia She used to take Klor-Con 20 mEq QD, refilled      Relevant Medications   potassium chloride SA (KLOR-CON M) 20 MEQ tablet   Vertigo   Her episodes of dizziness and mild nausea likely due to vertigo Meclizine as needed for dizziness Avoid sudden positional changes Maintain at least 64 ounces of fluid intake and eat at regular intervals Strictly advised to avoid extra dosing of antihypertensives without medical guidance      Relevant Medications   meclizine (ANTIVERT) 25 MG tablet   GAD (generalized anxiety disorder)   Uncontrolled currently Refilled Xanax 2 mg 4 times daily for now PDMP reviewed - has been on Xanax for many years      Relevant Medications   alprazolam (XANAX) 2 MG tablet     Meds ordered this encounter   Medications   hydrochlorothiazide (HYDRODIURIL) 25 MG tablet    Sig: Take 1 tablet (25 mg total) by mouth daily.    Dispense:  90 tablet    Refill:  1   enalapril (VASOTEC) 20 MG tablet    Sig: Take 1 tablet (20 mg total) by mouth daily.    Dispense:  90 tablet    Refill:  1   alprazolam (XANAX) 2 MG tablet    Sig: Take 1 tablet (2 mg total) by mouth in the morning, at noon, in the evening, and at bedtime.    Dispense:  120 tablet    Refill:  0   potassium chloride SA (KLOR-CON M) 20 MEQ tablet    Sig: Take 1 tablet (20 mEq total) by mouth daily.    Dispense:  30 tablet    Refill:  3   meclizine (ANTIVERT) 25 MG tablet    Sig: Take 1 tablet (25 mg total) by mouth 2 (two) times daily as needed for dizziness.    Dispense:  30 tablet    Refill:  0     Nia Nathaniel MARLA Blanch, MD

## 2024-04-12 NOTE — Assessment & Plan Note (Signed)
 Last CMP showed hypokalemia She used to take Klor-Con 20 mEq QD, refilled

## 2024-04-12 NOTE — Assessment & Plan Note (Signed)
 Uncontrolled currently Refilled Xanax 2 mg 4 times daily for now PDMP reviewed - has been on Xanax for many years

## 2024-04-12 NOTE — Assessment & Plan Note (Signed)
 BP Readings from Last 1 Encounters:  04/12/24 106/70   Low normal with enalapril 40 mg dose and HCTZ 25 mg QD Refilled enalapril 20 mg QD and HCTZ 25 mg QD Strictly advised to avoid extra dosing without medical guidance Counseled for compliance with the medications Advised DASH diet and moderate exercise/walking, at least 150 mins/week

## 2024-04-12 NOTE — Patient Instructions (Addendum)
 Please take Enalapril and HCTZ as prescribed.  Please start taking Potassium supplement as prescribed.  Please maintain at least 64 ounces of fluid intake in a day and eat at regular intervals.  Please take Meclizine as needed for dizziness. Please avoid sudden positional changes.

## 2024-04-21 ENCOUNTER — Encounter: Payer: Self-pay | Admitting: Family Medicine

## 2024-04-21 ENCOUNTER — Ambulatory Visit: Admitting: Family Medicine

## 2024-04-21 VITALS — BP 127/87 | HR 116 | Ht 66.0 in | Wt 145.0 lb

## 2024-04-21 DIAGNOSIS — E876 Hypokalemia: Secondary | ICD-10-CM | POA: Diagnosis not present

## 2024-04-21 DIAGNOSIS — F411 Generalized anxiety disorder: Secondary | ICD-10-CM | POA: Diagnosis not present

## 2024-04-21 DIAGNOSIS — I10 Essential (primary) hypertension: Secondary | ICD-10-CM

## 2024-04-21 MED ORDER — HYDROCHLOROTHIAZIDE 25 MG PO TABS: 25.0000 mg | ORAL_TABLET | Freq: Every day | ORAL | 1 refills | Status: AC

## 2024-04-21 MED ORDER — ENALAPRIL MALEATE 20 MG PO TABS: 20.0000 mg | ORAL_TABLET | Freq: Every day | ORAL | 1 refills | Status: AC

## 2024-04-21 MED ORDER — POTASSIUM CHLORIDE CRYS ER 20 MEQ PO TBCR: 20.0000 meq | EXTENDED_RELEASE_TABLET | Freq: Every day | ORAL | 3 refills | Status: AC

## 2024-04-21 MED ORDER — ALPRAZOLAM 2 MG PO TABS: 2.0000 mg | ORAL_TABLET | Freq: Four times a day (QID) | ORAL | 0 refills | Status: DC

## 2024-04-21 NOTE — Progress Notes (Signed)
 Established Patient Office Visit  Subjective:  Patient ID: Alison Allen, female    DOB: 11/09/1955  Age: 68 y.o. MRN: [id number]  CC:  Chief Complaint  Patient presents with   Medical Management of Chronic Issues    Follow up     HPI Alison Allen is a 68 y.o. female with past medical history of coronary hypertension, anxiety, hypokalemia presents for f/u of  chronic medical conditions. For the details of today's visit, please refer to the assessment and plan.   Past Medical History:  Diagnosis Date   Anxiety    Hypertension     Past Surgical History:  Procedure Laterality Date   COLONOSCOPY WITH PROPOFOL  N/A 06/18/2020   Procedure: COLONOSCOPY WITH PROPOFOL ;  Surgeon: Cindie Carlin POUR, DO;  [facility];  Service: Endoscopy;  Laterality: N/A;  3:00pm   NOVASURE ABLATION     POLYPECTOMY  06/18/2020   Procedure: POLYPECTOMY;  Surgeon: Cindie Carlin POUR, DO;  [facility];  Service: Endoscopy;;   TUBAL LIGATION      Family History  Problem Relation Age of Onset   Dementia Mother        deceased at age 41   Stroke Father 12       deceased due to stroke   Colon polyps Sister        in mid 60s with 36 adenomas   Liver cancer Sister    Seizures Son    Hypertension Other     Social History   Socioeconomic History   Marital status: Widowed    Spouse name: Not on file   Number of children: Not on file   Years of education: Not on file   Highest education level: 12th grade  Occupational History   Not on file  Tobacco Use   Smoking status: Former    Types: Cigarettes   Smokeless tobacco: Never  Vaping Use   Vaping status: Never Used  Substance and Sexual Activity   Alcohol use: Yes    Comment: she drinks around 4 days per week, mostly 2-3 beers per day but sometimes up to six. has been drinking regularly for 2-3 years   Drug use: Never   Sexual activity: Yes  Other Topics Concern   Not on file  Social History Narrative   Not on  file   Social Drivers of Health   Financial Resource Strain: Low Risk  (04/11/2024)   Overall Financial Resource Strain (CARDIA)    Difficulty of Paying Living Expenses: Not hard at all  Food Insecurity: No Food Insecurity (04/11/2024)   Hunger Vital Sign    Worried About Running Out of Food in the Last Year: Never true    Ran Out of Food in the Last Year: Never true  Transportation Needs: No Transportation Needs (04/11/2024)   PRAPARE - Administrator, Civil Service (Medical): No    Lack of Transportation (Non-Medical): No  Physical Activity: Unknown (04/11/2024)   Exercise Vital Sign    Days of Exercise per Week: Patient declined    Minutes of Exercise per Session: Not on file  Stress: Stress Concern Present (04/11/2024)   [employer] of Occupational Health - Occupational Stress Questionnaire    Feeling of Stress: To some extent  Social Connections: Unknown (04/11/2024)   Social Connection and Isolation Panel    Frequency of Communication with Friends and Family: More than three times a week    Frequency of Social Gatherings with Friends  and Family: More than three times a week    Attends Religious Services: Patient declined    Active Member of Clubs or Organizations: Patient declined    Attends Banker Meetings: Not on file    Marital Status: Widowed  Intimate Partner Violence: Not on file    Outpatient Medications Prior to Visit  Medication Sig Dispense Refill   CALCIUM-VITAMIN D PO Take 1 tablet by mouth daily.     meclizine (ANTIVERT) 25 MG tablet Take 1 tablet (25 mg total) by mouth 2 (two) times daily as needed for dizziness. 30 tablet 0   Multiple Vitamins-Minerals (MULTIVITAMIN WITH MINERALS) tablet Take 1 tablet by mouth daily.     alprazolam (XANAX) 2 MG tablet Take 1 tablet (2 mg total) by mouth in the morning, at noon, in the evening, and at bedtime. 120 tablet 0   enalapril (VASOTEC) 20 MG tablet Take 1 tablet (20 mg total) by mouth  daily. 90 tablet 1   hydrochlorothiazide (HYDRODIURIL) 25 MG tablet Take 1 tablet (25 mg total) by mouth daily. 90 tablet 1   potassium chloride SA (KLOR-CON M) 20 MEQ tablet Take 1 tablet (20 mEq total) by mouth daily. 30 tablet 3   No facility-administered medications prior to visit.    No Known Allergies  ROS Review of Systems  Constitutional:  Negative for chills and fever.  Eyes:  Negative for visual disturbance.  Respiratory:  Negative for chest tightness and shortness of breath.   Neurological:  Negative for dizziness and headaches.      Objective:    Physical Exam HENT:     Head: Normocephalic.     Mouth/Throat:     Mouth: Mucous membranes are moist.  Cardiovascular:     Rate and Rhythm: Normal rate.     Heart sounds: Normal heart sounds.  Pulmonary:     Effort: Pulmonary effort is normal.     Breath sounds: Normal breath sounds.  Neurological:     Mental Status: She is alert.     BP 127/87   Pulse (!) 116   Ht 5' 6 (1.676 m)   Wt 145 lb (65.8 kg)   SpO2 95%   BMI 23.40 kg/m  Wt Readings from Last 3 Encounters:  04/21/24 145 lb (65.8 kg)  04/12/24 141 lb (64 kg)  03/02/24 143 lb (64.9 kg)    No results found for: TSH Lab Results  Component Value Date   WBC 9.3 04/04/2024   HGB 14.7 04/04/2024   HCT 43.6 04/04/2024   MCV 100 (H) 04/04/2024   PLT 255 04/04/2024   Lab Results  Component Value Date   NA 134 04/04/2024   K 3.3 (L) 04/04/2024   CO2 25 04/04/2024   GLUCOSE 105 (H) 04/04/2024   BUN 7 (L) 04/04/2024   CREATININE 0.61 04/04/2024   BILITOT 0.6 04/04/2024   ALKPHOS 146 (H) 04/04/2024   AST 94 (H) 04/04/2024   ALT 66 (H) 04/04/2024   PROT 6.6 04/04/2024   ALBUMIN 4.1 04/04/2024   CALCIUM 9.6 04/04/2024   EGFR 99 04/04/2024   No results found for: CHOL No results found for: HDL No results found for: LDLCALC No results found for: TRIG No results found for: CHOLHDL No results found for: YHAJ8R    Assessment &  Plan:  Primary hypertension Assessment & Plan: Encouraged to continue taking hydrochlorothiazide 25 mg daily, enalapril 20 mg daily, and potassium 20 mEq daily. Advised to maintain a low-sodium diet and increase physical  activity as tolerated.   Orders: -     Enalapril Maleate; Take 1 tablet (20 mg total) by mouth daily.  Dispense: 90 tablet; Refill: 1 -     hydroCHLOROthiazide; Take 1 tablet (25 mg total) by mouth daily.  Dispense: 90 tablet; Refill: 1  GAD (generalized anxiety disorder) Assessment & Plan: GAD-7 is 0 Denies SI/HI and AVH encouraged to continue treatment regimen as prescribed   Orders: -     ALPRAZolam; Take 1 tablet (2 mg total) by mouth in the morning, at noon, in the evening, and at bedtime.  Dispense: 120 tablet; Refill: 0  Hypokalemia Assessment & Plan: Refill since the pharmacy pending CNP  Orders: -     Potassium Chloride Crys ER; Take 1 tablet (20 mEq total) by mouth daily.  Dispense: 30 tablet; Refill: 3  Note: This chart has been completed using Engineer, civil (consulting) software, and while attempts have been made to ensure accuracy, certain words and phrases may not be transcribed as intended.    Follow-up: Return in about 4 months (around 08/22/2024).   Anaysia Germer  Z Bacchus, FNP

## 2024-04-21 NOTE — Assessment & Plan Note (Signed)
 Encouraged to continue taking hydrochlorothiazide 25 mg daily, enalapril 20 mg daily, and potassium 20 mEq daily. Advised to maintain a low-sodium diet and increase physical activity as tolerated.

## 2024-04-21 NOTE — Assessment & Plan Note (Signed)
 Refill since the pharmacy pending CNP

## 2024-04-21 NOTE — Patient Instructions (Addendum)
 I appreciate the opportunity to provide care to you today!    Follow up:  4 months  Labs: next visit  Schedule medicare annual wellness visit   Please follow up if your symptoms worsen or fail to improve.  Please continue to a heart-healthy diet and increase your physical activities. Try to exercise for at least five days a week.    It was a pleasure to see you and I look forward to continuing to work together on your health and well-being. Please do not hesitate to call the office if you need care or have questions about your care.  In case of emergency, please visit the Emergency Department for urgent care, or contact our clinic at [phone] to schedule an appointment. We're here to help you!   Have a wonderful day and week. With Gratitude, Meade JENEANE Gerlach MSN, FNP-BC, PMHNP-BC

## 2024-04-21 NOTE — Assessment & Plan Note (Addendum)
 GAD-7 is 0 Denies SI/HI and AVH encouraged to continue treatment regimen as prescribed

## 2024-04-28 ENCOUNTER — Ambulatory Visit: Payer: Self-pay | Admitting: Gastroenterology

## 2024-04-28 DIAGNOSIS — R7989 Other specified abnormal findings of blood chemistry: Secondary | ICD-10-CM

## 2024-05-05 ENCOUNTER — Other Ambulatory Visit: Payer: Self-pay

## 2024-05-23 ENCOUNTER — Ambulatory Visit: Admitting: Nurse Practitioner

## 2024-05-30 ENCOUNTER — Other Ambulatory Visit: Payer: Self-pay

## 2024-05-30 DIAGNOSIS — R7989 Other specified abnormal findings of blood chemistry: Secondary | ICD-10-CM

## 2024-06-09 ENCOUNTER — Other Ambulatory Visit: Payer: Self-pay | Admitting: Internal Medicine

## 2024-06-09 DIAGNOSIS — F411 Generalized anxiety disorder: Secondary | ICD-10-CM

## 2024-06-30 ENCOUNTER — Encounter: Payer: Self-pay | Admitting: Gastroenterology

## 2024-07-11 ENCOUNTER — Other Ambulatory Visit: Payer: Self-pay | Admitting: Family Medicine

## 2024-07-11 DIAGNOSIS — F411 Generalized anxiety disorder: Secondary | ICD-10-CM

## 2024-07-22 ENCOUNTER — Ambulatory Visit: Payer: Self-pay

## 2024-08-23 ENCOUNTER — Ambulatory Visit
